# Patient Record
Sex: Female | Born: 1990 | Race: White | Hispanic: No | Marital: Married | Smoking: Current every day smoker
Health system: Southern US, Community
[De-identification: ages and names within clinical notes are randomized; demographics above are authoritative.]

## PROBLEM LIST (undated history)

## (undated) ENCOUNTER — Inpatient Hospital Stay (HOSPITAL_COMMUNITY): Payer: Self-pay

## (undated) DIAGNOSIS — F419 Anxiety disorder, unspecified: Secondary | ICD-10-CM

## (undated) DIAGNOSIS — L732 Hidradenitis suppurativa: Secondary | ICD-10-CM

## (undated) DIAGNOSIS — F32A Depression, unspecified: Secondary | ICD-10-CM

## (undated) DIAGNOSIS — F329 Major depressive disorder, single episode, unspecified: Secondary | ICD-10-CM

## (undated) DIAGNOSIS — J45909 Unspecified asthma, uncomplicated: Secondary | ICD-10-CM

## (undated) HISTORY — PX: TONSILLECTOMY: SUR1361

## (undated) HISTORY — PX: DILATION AND CURETTAGE OF UTERUS: SHX78

## (undated) HISTORY — PX: RHINOPLASTY: SUR1284

---
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
## 2017-02-06 ENCOUNTER — Emergency Department (HOSPITAL_COMMUNITY): Payer: 59

## 2017-02-06 ENCOUNTER — Encounter (HOSPITAL_COMMUNITY): Payer: Self-pay

## 2017-02-06 ENCOUNTER — Inpatient Hospital Stay (HOSPITAL_COMMUNITY)
Admission: AD | Admit: 2017-02-06 | Discharge: 2017-02-06 | Disposition: A | Discharge status: Home / Self Care | Payer: 59 | Attending: Obstetrics & Gynecology | Admitting: Obstetrics & Gynecology

## 2017-02-06 DIAGNOSIS — O3680X1 Pregnancy with inconclusive fetal viability, fetus 1: Secondary | ICD-10-CM

## 2017-02-06 DIAGNOSIS — O209 Hemorrhage in early pregnancy, unspecified: Secondary | ICD-10-CM | POA: Diagnosis not present

## 2017-02-06 DIAGNOSIS — O26899 Other specified pregnancy related conditions, unspecified trimester: Secondary | ICD-10-CM | POA: Diagnosis present

## 2017-02-06 DIAGNOSIS — R102 Pelvic and perineal pain: Secondary | ICD-10-CM | POA: Diagnosis present

## 2017-02-06 DIAGNOSIS — Z3A01 Less than 8 weeks gestation of pregnancy: Secondary | ICD-10-CM | POA: Insufficient documentation

## 2017-02-06 DIAGNOSIS — O3680X0 Pregnancy with inconclusive fetal viability, not applicable or unspecified: Secondary | ICD-10-CM

## 2017-02-06 DIAGNOSIS — O26891 Other specified pregnancy related conditions, first trimester: Secondary | ICD-10-CM | POA: Diagnosis not present

## 2017-02-06 DIAGNOSIS — O4690 Antepartum hemorrhage, unspecified, unspecified trimester: Secondary | ICD-10-CM

## 2017-02-06 HISTORY — DX: Major depressive disorder, single episode, unspecified: F32.9

## 2017-02-06 HISTORY — DX: Unspecified asthma, uncomplicated: J45.909

## 2017-02-06 HISTORY — DX: Depression, unspecified: F32.A

## 2017-02-06 HISTORY — DX: Anxiety disorder, unspecified: F41.9

## 2017-02-06 LAB — URINALYSIS, ROUTINE W REFLEX MICROSCOPIC
Bilirubin Urine: NEGATIVE
Glucose, UA: NEGATIVE mg/dL
Ketones, ur: NEGATIVE mg/dL
Nitrite: NEGATIVE
Protein, ur: NEGATIVE mg/dL
Specific Gravity, Urine: 1.017 (ref 1.005–1.030)
pH: 6 (ref 5.0–8.0)

## 2017-02-06 LAB — CBC
HCT: 41.5 % (ref 36.0–46.0)
HEMATOCRIT: 39.2 % (ref 36.0–46.0)
Hemoglobin: 14 g/dL (ref 12.0–15.0)
Hemoglobin: 13.5 g/dL (ref 12.0–15.0)
MCH: 29.2 pg (ref 26.0–34.0)
MCH: 29.9 pg (ref 26.0–34.0)
MCHC: 33.7 g/dL (ref 30.0–36.0)
MCHC: 34.4 g/dL (ref 30.0–36.0)
MCV: 86.6 fL (ref 78.0–100.0)
MCV: 86.7 fL (ref 78.0–100.0)
Platelets: 271 10*3/uL (ref 150–400)
Platelets: 233 10*3/uL (ref 150–400)
RBC: 4.79 MIL/uL (ref 3.87–5.11)
RBC: 4.52 MIL/uL (ref 3.87–5.11)
RDW: 13.1 % (ref 11.5–15.5)
RDW: 13.4 % (ref 11.5–15.5)
WBC: 9.7 10*3/uL (ref 4.0–10.5)
WBC: 8.3 10*3/uL (ref 4.0–10.5)

## 2017-02-06 LAB — COMPREHENSIVE METABOLIC PANEL
ALT: 11 U/L — ABNORMAL LOW (ref 14–54)
AST: 16 U/L (ref 15–41)
Albumin: 3.6 g/dL (ref 3.5–5.0)
Alkaline Phosphatase: 55 U/L (ref 38–126)
Anion gap: 7 (ref 5–15)
BUN: 9 mg/dL (ref 6–20)
CO2: 22 mmol/L (ref 22–32)
Calcium: 8.9 mg/dL (ref 8.9–10.3)
Chloride: 106 mmol/L (ref 101–111)
Creatinine, Ser: 0.57 mg/dL (ref 0.44–1.00)
GFR calc Af Amer: >60 mL/min (ref >60)
GFR calc non Af Amer: >60 mL/min (ref >60)
Glucose, Bld: 92 mg/dL (ref 65–99)
Potassium: 3.6 mmol/L (ref 3.5–5.1)
Sodium: 135 mmol/L (ref 135–145)
Total Bilirubin: 0.5 mg/dL (ref 0.3–1.2)
Total Protein: 6.8 g/dL (ref 6.5–8.1)

## 2017-02-06 LAB — WET PREP, GENITAL
Sperm: NONE SEEN
Trich, Wet Prep: NONE SEEN
Yeast Wet Prep HPF POC: NONE SEEN

## 2017-02-06 LAB — LIPASE, BLOOD: Lipase: 35 U/L (ref 11–51)

## 2017-02-06 LAB — PREGNANCY, URINE: Preg Test, Ur: POSITIVE — AB

## 2017-02-06 LAB — TYPE AND SCREEN
ABO/RH(D): B POS
ANTIBODY SCREEN: NEGATIVE

## 2017-02-06 LAB — HCG, QUANTITATIVE, PREGNANCY: hCG, Beta Chain, Quant, S: 467 m[IU]/mL — ABNORMAL HIGH (ref <5)

## 2017-02-06 LAB — ABO/RH
ABO/RH(D): B POS
ABO/RH(D): B POS

## 2017-02-06 MED ORDER — PROMETHAZINE HCL 25 MG PO TABS: 25.0000 mg | ORAL_TABLET | Freq: Four times a day (QID) | ORAL | 0 refills | Status: DC | PRN

## 2017-02-06 MED ORDER — SODIUM CHLORIDE 0.9 % IV BOLUS (SEPSIS)
1000.0000 mL | Freq: Once | INTRAVENOUS | Status: AC
  Administered 2017-02-06: 1000 mL via INTRAVENOUS

## 2017-02-06 MED ORDER — MORPHINE SULFATE (PF) 4 MG/ML IV SOLN
4.0000 mg | Freq: Once | INTRAVENOUS | Status: AC
  Administered 2017-02-06: 4 mg via INTRAVENOUS
  Filled 2017-02-06: qty 1

## 2017-02-06 MED ORDER — LACTATED RINGERS IV SOLN
INTRAVENOUS | Status: DC
  Administered 2017-02-06: 09:00:00 via INTRAVENOUS

## 2017-02-06 MED ORDER — HYDROMORPHONE HCL 1 MG/ML IJ SOLN
1.0000 mg | Freq: Once | INTRAMUSCULAR | Status: AC
  Administered 2017-02-06: 1 mg via INTRAVENOUS
  Filled 2017-02-06: qty 1

## 2017-02-06 MED ORDER — PROMETHAZINE HCL 25 MG/ML IJ SOLN
25.0000 mg | Freq: Once | INTRAMUSCULAR | Status: AC
  Administered 2017-02-06: 25 mg via INTRAVENOUS
  Filled 2017-02-06: qty 1

## 2017-02-06 MED ORDER — OXYCODONE-ACETAMINOPHEN 5-325 MG PO TABS: 1.0000 | ORAL_TABLET | Freq: Four times a day (QID) | ORAL | 0 refills | Status: DC | PRN

## 2017-02-06 NOTE — ED Notes (Signed)
Pt went to give urine sample and  States she has started bleeding a moderate amount; Pt was given sanitary pad; and chart updated

## 2017-02-06 NOTE — ED Provider Notes (Addendum)
[facility]-EMERGENCY DEPT Provider Note   CSN: [id number] Arrival date & time: 02/06/17  0136     History   Chief Complaint Chief Complaint  Patient presents with  . Abdominal Pain  . Possible Pregnancy  . Threatened Miscarriage    HPI Brittany Maldonado is a 26 y.o. female.  Patient presents to the emergency department with chief complaint of vaginal bleeding and abdominal pain. She reports that she took an at-home pregnancy test last week and it was positive. She reports worsening symptoms over the past couple of days. Symptoms are worsened with palpation and movement. She denies any dysuria or hematuria. She does report that she has had some nausea, but denies any vomiting. She does not currently have an OB/GYN. There are no other associated symptoms. There are no other modifying factors.   The history is provided by the patient. No language interpreter was used.    History reviewed. No pertinent past medical history.  There are no active problems to display for this patient.   History reviewed. No pertinent surgical history.  OB History    Gravida Para Term Preterm AB Living   1             SAB TAB Ectopic Multiple Live Births                   Home Medications    Prior to Admission medications   Not on File    Family History No family history on file.  Social History Social History  Substance Use Topics  . Smoking status: Former Smoker    Quit date: 02/05/2017  . Smokeless tobacco: Not on file  . Alcohol use Not on file     Allergies   Patient has no allergy information on record.   Review of Systems Review of Systems  All other systems reviewed and are negative.    Physical Exam Updated Vital Signs BP (!) 97/55   Pulse 73   Temp 98.6 F (37 C) (Oral)   Resp (!) 22   LMP  (LMP Unknown)   SpO2 99%   Physical Exam  Constitutional: She is oriented to person, place, and time. She appears well-developed and well-nourished.  HENT:  Head:  Normocephalic and atraumatic.  Eyes: Conjunctivae and EOM are normal. Pupils are equal, round, and reactive to light.  Neck: Normal range of motion. Neck supple.  Cardiovascular: Normal rate and regular rhythm.  Exam reveals no gallop and no friction rub.   No murmur heard. Pulmonary/Chest: Effort normal and breath sounds normal. No respiratory distress. She has no wheezes. She has no rales. She exhibits no tenderness.  Abdominal: Soft. Bowel sounds are normal. She exhibits no distension and no mass. There is no tenderness. There is no rebound and no guarding.  Genitourinary:  Genitourinary Comments: Pelvic exam chaperoned by female ER tech, no right or left adnexal tenderness, no uterine tenderness, scant vaginal bleeding, no CMT or friability, no foreign body, no injury to the external genitalia, no other significant findings   Musculoskeletal: Normal range of motion. She exhibits no edema or tenderness.  Neurological: She is alert and oriented to person, place, and time.  Skin: Skin is warm and dry.  Psychiatric: She has a normal mood and affect. Her behavior is normal. Judgment and thought content normal.  Nursing note and vitals reviewed.    ED Treatments / Results  Labs (all labs ordered are listed, but only abnormal results are displayed) Labs Reviewed  COMPREHENSIVE METABOLIC  PANEL - Abnormal; Notable for the following:       Result Value   ALT 11 (*)    All other components within normal limits  URINALYSIS, ROUTINE W REFLEX MICROSCOPIC - Abnormal; Notable for the following:    Hgb urine dipstick LARGE (*)    Leukocytes, UA SMALL (*)    Bacteria, UA RARE (*)    Squamous Epithelial / LPF 0-5 (*)    All other components within normal limits  PREGNANCY, URINE - Abnormal; Notable for the following:    Preg Test, Ur POSITIVE (*)    All other components within normal limits  HCG, QUANTITATIVE, PREGNANCY - Abnormal; Notable for the following:    hCG, Beta Chain, Quant, S 467 (*)     All other components within normal limits  WET PREP, GENITAL  LIPASE, BLOOD  CBC  I-STAT BETA HCG BLOOD, ED ([facility], [facility], [facility] ONLY)  ABO/RH  GC/CHLAMYDIA PROBE AMP ([place]) NOT AT [facility]    EKG  EKG Interpretation None       Radiology US Ob Comp Less 14 Wks  Result Date: 02/06/2017 CLINICAL DATA:  Acute onset of pelvic pain.  Initial encounter. EXAM: OBSTETRIC <14 WK [place] AND TRANSVAGINAL OB US TECHNIQUE: Both transabdominal and transvaginal ultrasound examinations were performed for complete evaluation of the gestation as well as the maternal uterus, adnexal regions, and pelvic cul-de-sac. Transvaginal technique was performed to assess early pregnancy. COMPARISON:  None. FINDINGS: Intrauterine gestational sac: None seen. Yolk sac:  N/A Embryo:  N/A Subchorionic hemorrhage:  None visualized. Maternal uterus/adnexae: A small amount of complex fluid is noted within the endometrial canal. There appears to be a small 0.9 cm complex lesion with a central cyst medial to the right ovary, raising suspicion for ectopic pregnancy. The ovaries are unremarkable in appearance. The right ovary measures 3.3 x 2.4 x 2.5 cm, with a corpus luteal cyst, while the left ovary measures 2.7 x 1.7 x 2.4 cm. A small amount of free fluid is noted at the right adnexa. IMPRESSION: 1. Apparent small 0.9 cm complex lesion with a central cyst noted medial to the right ovary, suspicious for ectopic pregnancy. Associated small amount of free fluid at the right adnexa. 2. No evidence for intrauterine pregnancy at this time. 3. Small amount of complex fluid noted within the endometrial canal. These results were called by telephone at the time of interpretation on 02/06/2017 at 6:12 am to [facility] PA, who verbally acknowledged these results. Electronically Signed   By: Roanna Raider M.D.   On: 02/06/2017 06:18   US Ob Transvaginal  Result Date: 02/06/2017 CLINICAL DATA:  Acute onset of pelvic pain.  Initial encounter. EXAM:  OBSTETRIC <14 WK [place] AND TRANSVAGINAL OB US TECHNIQUE: Both transabdominal and transvaginal ultrasound examinations were performed for complete evaluation of the gestation as well as the maternal uterus, adnexal regions, and pelvic cul-de-sac. Transvaginal technique was performed to assess early pregnancy. COMPARISON:  None. FINDINGS: Intrauterine gestational sac: None seen. Yolk sac:  N/A Embryo:  N/A Subchorionic hemorrhage:  None visualized. Maternal uterus/adnexae: A small amount of complex fluid is noted within the endometrial canal. There appears to be a small 0.9 cm complex lesion with a central cyst medial to the right ovary, raising suspicion for ectopic pregnancy. The ovaries are unremarkable in appearance. The right ovary measures 3.3 x 2.4 x 2.5 cm, with a corpus luteal cyst, while the left ovary measures 2.7 x 1.7 x 2.4 cm. A small amount of free  fluid is noted at the right adnexa. IMPRESSION: 1. Apparent small 0.9 cm complex lesion with a central cyst noted medial to the right ovary, suspicious for ectopic pregnancy. Associated small amount of free fluid at the right adnexa. 2. No evidence for intrauterine pregnancy at this time. 3. Small amount of complex fluid noted within the endometrial canal. These results were called by telephone at the time of interpretation on 02/06/2017 at 6:12 am to [facility], who verbally acknowledged these results. Electronically Signed   By: Roanna RaiderJeffery  Chang M.D.   On: 02/06/2017 06:18    Procedures Procedures (including critical care time) CRITICAL CARE Performed by: Roxy HorsemanBROWNING, Khayri Kargbo   Total critical care time: 35 minutes  Critical care time was exclusive of separately billable procedures and treating other patients.  Critical care was necessary to treat or prevent imminent or life-threatening deterioration.  Critical care was time spent personally by me on the following activities: development of treatment plan with patient and/or surrogate as well as  nursing, discussions with consultants, evaluation of patient's response to treatment, examination of patient, obtaining history from patient or surrogate, ordering and performing treatments and interventions, ordering and review of laboratory studies, ordering and review of radiographic studies, pulse oximetry and re-evaluation of patient's condition.  Medications Ordered in ED Medications  morphine 4 MG/ML injection 4 mg (not administered)     Initial Impression / Assessment and Plan / ED Course  I have reviewed the triage vital signs and the nursing notes.  Pertinent labs & imaging results that were available during my care of the patient were reviewed by me and considered in my medical decision making (see chart for details).     Patient with vaginal bleeding, abdominal pain, and recent positive pregnancy test. Will check Rh, hCG, labs, and likely ultrasound given pain.  US shows concern for possible ectopic pregnancy.  I discussed the patient with Dr. Silverio LayYao, who recommends consultation with OBGYN.  Spoke with on-call and [facility], who recommends transferring patient now for further evaluation.  Final Clinical Impressions(s) / ED Diagnoses   Final diagnoses:  Vaginal bleeding in pregnancy  Pregnancy of unknown anatomic location    New Prescriptions New Prescriptions   No medications on file     Felipa FurnaceBrowning, Evalise Abruzzese, PA-C 02/06/17 [id number]    Charlynne PanderYao, David Hsienta, MD 02/06/17 0730    Roxy HorsemanBrowning, Aspen Deterding, PA-C 02/06/17 2316

## 2017-02-06 NOTE — MAU Note (Signed)
[facility] further eval for suspected ectopic preg.  Shoots up to ribs, and sharp pressure on rectum when sits up, worse when walks.

## 2017-02-06 NOTE — ED Triage Notes (Signed)
Pt states she has onset of abdominal pain with pressure; Pt states sx started today; pt states she has positive pregnancy test about a week ago. Pt states pain at 8/10 on arrival.

## 2017-02-06 NOTE — Discharge Instructions (Signed)
Return to care  °· If you have heavier bleeding that soaks through more that 2 pads per hour for an hour or more °· If you bleed so much that you feel like you might pass out or you do pass out °· If you have significant abdominal pain that is not improved with Tylenol  °· If you develop a fever > 100.5 ° ° °Abdominal Pain During Pregnancy °Abdominal pain is common in pregnancy. Most of the time, it does not cause harm. There are many causes of abdominal pain. Some causes are more serious than others and sometimes the cause is not known. Abdominal pain can be a sign that something is very wrong with the pregnancy or the pain may have nothing to do with the pregnancy. Always tell your health care provider if you have any abdominal pain. °Follow these instructions at home: °· Do not have sex or put anything in your vagina until your symptoms go away completely. °· Watch your abdominal pain for any changes. °· Get plenty of rest until your pain improves. °· Drink enough fluid to keep your urine clear or pale yellow. °· Take over-the-counter or prescription medicines only as told by your health care provider. °· Keep all follow-up visits as told by your health care provider. This is important. °Contact a health care provider if: °· You have a fever. °· Your pain gets worse or you have cramping. °· Your pain continues after resting. °Get help right away if: °· You are bleeding, leaking fluid, or passing tissue from the vagina. °· You have vomiting or diarrhea that does not go away. °· You have painful or bloody urination. °· You notice a decrease in your baby's movements. °· You feel very weak or faint. °· You have shortness of breath. °· You develop a severe headache with abdominal pain. °· You have abnormal vaginal discharge with abdominal pain. °This information is not intended to replace advice given to you by your health care provider. Make sure you discuss any questions you have with your health care  provider. °Document Released: 08/24/2005 Document Revised: 06/04/2016 Document Reviewed: 03/23/2013 °Elsevier Interactive Patient Education © 2018 Elsevier Inc. ° °

## 2017-02-06 NOTE — ED Notes (Signed)
Patient transported to Ultrasound 

## 2017-02-06 NOTE — MAU Provider Note (Signed)
History     CSN: [id number]  Arrival date and time: 02/06/17 0136  First Provider Initiated Contact with Patient 02/06/17 [phone]      Chief Complaint  Patient presents with  . Abdominal Pain  . Vaginal Bleeding   HPI Zyair Rhein is a 26 y.o. G3P1011 at [redacted]w[redacted]d by LMP who presents as a transfer from [facility] for ectopic evaluation. Patient reports having a positive pregnancy test last week; has not seen ob/gyn yet. Reports lower abdominal cramping for the last week that worsened overnight. Since last night has had sharp constant abdominal pain that is worse in the right lower quadrant. Varying intensity that is worse with sitting up, walking, and standing. Rates pain 9/10. Was given morphine at the ED with mild relief. Also reports red spotting since yesterday. Endorses nausea; no vomiting.  OB History    Gravida Para Term Preterm AB Living   3 1 1  0 1 1   SAB TAB Ectopic Multiple Live Births     1     1      Past Medical History:  Diagnosis Date  . Anxiety   . Asthma    exercise induced  . Depression    good  right now    Past Surgical History:  Procedure Laterality Date  . RHINOPLASTY     reset broken nose    Family History  Problem Relation Age of Onset  . COPD Father   . Stroke Maternal Grandfather   . Cancer Paternal Grandmother        lung    Social History  Substance Use Topics  . Smoking status: Former Smoker    Years: 9.00    Types: Cigarettes    Quit date: 02/05/2017  . Smokeless tobacco: Never Used  . Alcohol use No    Allergies: No Known Allergies  No prescriptions prior to admission.    Review of Systems  Constitutional: Negative.   Gastrointestinal: Positive for abdominal pain, nausea and rectal pain. Negative for abdominal distention, constipation, diarrhea and vomiting.  Genitourinary: Positive for vaginal bleeding.   Physical Exam   Blood pressure (!) 105/52, pulse (!) 53, temperature 98.4 F (36.9 C), temperature source Oral, resp. rate  16, last menstrual period 01/05/2017, SpO2 99 %.  Physical Exam  Nursing note and vitals reviewed. Constitutional: She is oriented to person, place, and time. She appears well-developed and well-nourished. No distress.  HENT:  Head: Normocephalic and atraumatic.  Eyes: Conjunctivae are normal. Right eye exhibits no discharge. Left eye exhibits no discharge. No scleral icterus.  Neck: Normal range of motion.  Cardiovascular: Normal rate, regular rhythm and normal heart sounds.   No murmur heard. Respiratory: Effort normal and breath sounds normal. No respiratory distress. She has no wheezes.  GI: Soft. She exhibits no distension. Bowel sounds are decreased. There is generalized tenderness (generalized TTP, worse in RLQ & suprapubic area). There is no rigidity, no rebound and no guarding.  Neurological: She is alert and oriented to person, place, and time.  Skin: Skin is warm and dry. She is not diaphoretic.  Psychiatric: She has a normal mood and affect. Her behavior is normal. Judgment and thought content normal.    MAU Course  Procedures Results for orders placed or performed during the hospital encounter of 02/06/17 (from the past 24 hour(s))  Lipase, blood     Status: None   Collection Time: 02/06/17  1:51 AM  Result Value Ref Range   Lipase 35 11 - 51 U/L  Comprehensive metabolic panel     Status: Abnormal   Collection Time: 02/06/17  1:51 AM  Result Value Ref Range   Sodium 135 135 - 145 mmol/L   Potassium 3.6 3.5 - 5.1 mmol/L   Chloride 106 101 - 111 mmol/L   CO2 22 22 - 32 mmol/L   Glucose, Bld 92 65 - 99 mg/dL   BUN 9 6 - 20 mg/dL   Creatinine, Ser 4.09 0.44 - 1.00 mg/dL   Calcium 8.9 8.9 - 81.1 mg/dL   Total Protein 6.8 6.5 - 8.1 g/dL   Albumin 3.6 3.5 - 5.0 g/dL   AST 16 15 - 41 U/L   ALT 11 (L) 14 - 54 U/L   Alkaline Phosphatase 55 38 - 126 U/L   Total Bilirubin 0.5 0.3 - 1.2 mg/dL   GFR calc non Af Amer >60 >60 mL/min   GFR calc Af Amer >60 >60 mL/min   Anion  gap 7 5 - 15  CBC     Status: None   Collection Time: 02/06/17  1:51 AM  Result Value Ref Range   WBC 9.7 4.0 - 10.5 K/uL   RBC 4.79 3.87 - 5.11 MIL/uL   Hemoglobin 14.0 12.0 - 15.0 g/dL   HCT 91.4 78.2 - 95.6 %   MCV 86.6 78.0 - 100.0 fL   MCH 29.2 26.0 - 34.0 pg   MCHC 33.7 30.0 - 36.0 g/dL   RDW 21.3 08.6 - 57.8 %   Platelets 271 150 - 400 K/uL  Urinalysis, Routine w reflex microscopic     Status: Abnormal   Collection Time: 02/06/17  1:52 AM  Result Value Ref Range   Color, Urine YELLOW YELLOW   APPearance CLEAR CLEAR   Specific Gravity, Urine 1.017 1.005 - 1.030   pH 6.0 5.0 - 8.0   Glucose, UA NEGATIVE NEGATIVE mg/dL   Hgb urine dipstick LARGE (A) NEGATIVE   Bilirubin Urine NEGATIVE NEGATIVE   Ketones, ur NEGATIVE NEGATIVE mg/dL   Protein, ur NEGATIVE NEGATIVE mg/dL   Nitrite NEGATIVE NEGATIVE   Leukocytes, UA SMALL (A) NEGATIVE   RBC / HPF TOO NUMEROUS TO COUNT 0 - 5 RBC/hpf   WBC, UA 0-5 0 - 5 WBC/hpf   Bacteria, UA RARE (A) NONE SEEN   Squamous Epithelial / LPF 0-5 (A) NONE SEEN   Mucous PRESENT   Pregnancy, urine     Status: Abnormal   Collection Time: 02/06/17  3:25 AM  Result Value Ref Range   Preg Test, Ur POSITIVE (A) NEGATIVE  ABO/Rh     Status: None   Collection Time: 02/06/17  4:27 AM  Result Value Ref Range   ABO/RH(D) B POS   hCG, quantitative, pregnancy     Status: Abnormal   Collection Time: 02/06/17  4:34 AM  Result Value Ref Range   hCG, Beta Chain, Quant, S 467 (H) <5 mIU/mL  Wet prep, genital     Status: Abnormal   Collection Time: 02/06/17  6:30 AM  Result Value Ref Range   Yeast Wet Prep HPF POC NONE SEEN NONE SEEN   Trich, Wet Prep NONE SEEN NONE SEEN   Clue Cells Wet Prep HPF POC PRESENT (A) NONE SEEN   WBC, Wet Prep HPF POC MANY (A) NONE SEEN   Sperm NONE SEEN   CBC     Status: None   Collection Time: 02/06/17  9:24 AM  Result Value Ref Range   WBC 8.3 4.0 - 10.5 K/uL   RBC  4.52 3.87 - 5.11 MIL/uL   Hemoglobin 13.5 12.0 - 15.0  g/dL   HCT 95.639.2 21.336.0 - 08.646.0 %   MCV 86.7 78.0 - 100.0 fL   MCH 29.9 26.0 - 34.0 pg   MCHC 34.4 30.0 - 36.0 g/dL   RDW 57.813.4 46.911.5 - 62.915.5 %   Platelets 233 150 - 400 K/uL   [place] Ob Comp Less 14 Wks  Result Date: 02/06/2017 CLINICAL DATA:  Acute onset of pelvic pain.  Initial encounter. EXAM: OBSTETRIC <14 WK US AND TRANSVAGINAL OB US TECHNIQUE: Both transabdominal and transvaginal ultrasound examinations were performed for complete evaluation of the gestation as well as the maternal uterus, adnexal regions, and pelvic cul-de-sac. Transvaginal technique was performed to assess early pregnancy. COMPARISON:  None. FINDINGS: Intrauterine gestational sac: None seen. Yolk sac:  N/A Embryo:  N/A Subchorionic hemorrhage:  None visualized. Maternal uterus/adnexae: A small amount of complex fluid is noted within the endometrial canal. There appears to be a small 0.9 cm complex lesion with a central cyst medial to the right ovary, raising suspicion for ectopic pregnancy. The ovaries are unremarkable in appearance. The right ovary measures 3.3 x 2.4 x 2.5 cm, with a corpus luteal cyst, while the left ovary measures 2.7 x 1.7 x 2.4 cm. A small amount of free fluid is noted at the right adnexa. IMPRESSION: 1. Apparent small 0.9 cm complex lesion with a central cyst noted medial to the right ovary, suspicious for ectopic pregnancy. Associated small amount of free fluid at the right adnexa. 2. No evidence for intrauterine pregnancy at this time. 3. Small amount of complex fluid noted within the endometrial canal. These results were called by telephone at the time of interpretation on 02/06/2017 at 6:12 am to [facility], who verbally acknowledged these results. Electronically Signed   By: Roanna RaiderJeffery  Chang M.D.   On: 02/06/2017 06:18   [place] Ob Transvaginal  Result Date: 02/06/2017 CLINICAL DATA:  Acute onset of pelvic pain.  Initial encounter. EXAM: OBSTETRIC <14 WK US AND TRANSVAGINAL OB US TECHNIQUE: Both transabdominal  and transvaginal ultrasound examinations were performed for complete evaluation of the gestation as well as the maternal uterus, adnexal regions, and pelvic cul-de-sac. Transvaginal technique was performed to assess early pregnancy. COMPARISON:  None. FINDINGS: Intrauterine gestational sac: None seen. Yolk sac:  N/A Embryo:  N/A Subchorionic hemorrhage:  None visualized. Maternal uterus/adnexae: A small amount of complex fluid is noted within the endometrial canal. There appears to be a small 0.9 cm complex lesion with a central cyst medial to the right ovary, raising suspicion for ectopic pregnancy. The ovaries are unremarkable in appearance. The right ovary measures 3.3 x 2.4 x 2.5 cm, with a corpus luteal cyst, while the left ovary measures 2.7 x 1.7 x 2.4 cm. A small amount of free fluid is noted at the right adnexa. IMPRESSION: 1. Apparent small 0.9 cm complex lesion with a central cyst noted medial to the right ovary, suspicious for ectopic pregnancy. Associated small amount of free fluid at the right adnexa. 2. No evidence for intrauterine pregnancy at this time. 3. Small amount of complex fluid noted within the endometrial canal. These results were called by telephone at the time of interpretation on 02/06/2017 at 6:12 am to [facility], who verbally acknowledged these results. Electronically Signed   By: Roanna RaiderJeffery  Chang M.D.   On: 02/06/2017 06:18    MDM B positive Labs & ultrasound performed at ED; suspicious right adnexal mass measuring 0.9 cm, small  free fluid at right adnexa; no IUP. BHCG 467.  Pt requesting pain & nausea medication; dilaudid 1 mg & phenergan 25 mg IV ordered NPO Repeat CBC -- hemoglobin stable  Improvement in symptoms after meds S/w Dr. Debroah Loop. Will repeat BHCG in 48 hrs  This abdominal pain & vaginal bleeding could represent a normal pregnancy, spontaneous abortion, or even an ectopic pregnancy which can be life-threatening. Cultures were obtained (at [facility]) to rule out  pelvic infection.   Assessment and Plan  A:  1. Pregnancy of unknown anatomic location   2. Pelvic pain during pregnancy   3. Vaginal bleeding in pregnancy    P: Discharge home Rx percocet #10 & phenergan Discussed reasons to return to MAU - including s/s of ectopic pregnancy Go to [facility] Monday morning for repeat BHCG GC[facility] pending   Judeth Horn 02/06/2017, 8:33 AM

## 2017-02-07 ENCOUNTER — Encounter (HOSPITAL_COMMUNITY): Payer: Self-pay | Admitting: *Deleted

## 2017-02-07 ENCOUNTER — Inpatient Hospital Stay (HOSPITAL_COMMUNITY)
Admission: AD | Admit: 2017-02-07 | Discharge: 2017-02-07 | Disposition: A | Discharge status: Home / Self Care | Payer: 59 | Source: Ambulatory Visit | Attending: Obstetrics and Gynecology | Admitting: Obstetrics and Gynecology

## 2017-02-07 ENCOUNTER — Inpatient Hospital Stay (HOSPITAL_COMMUNITY): Payer: 59

## 2017-02-07 DIAGNOSIS — O26891 Other specified pregnancy related conditions, first trimester: Secondary | ICD-10-CM | POA: Diagnosis not present

## 2017-02-07 DIAGNOSIS — R42 Dizziness and giddiness: Secondary | ICD-10-CM | POA: Insufficient documentation

## 2017-02-07 DIAGNOSIS — O3680X1 Pregnancy with inconclusive fetal viability, fetus 1: Secondary | ICD-10-CM

## 2017-02-07 DIAGNOSIS — R1031 Right lower quadrant pain: Secondary | ICD-10-CM | POA: Diagnosis not present

## 2017-02-07 DIAGNOSIS — O3680X0 Pregnancy with inconclusive fetal viability, not applicable or unspecified: Secondary | ICD-10-CM

## 2017-02-07 DIAGNOSIS — Z87891 Personal history of nicotine dependence: Secondary | ICD-10-CM | POA: Insufficient documentation

## 2017-02-07 DIAGNOSIS — Z3A01 Less than 8 weeks gestation of pregnancy: Secondary | ICD-10-CM | POA: Diagnosis not present

## 2017-02-07 DIAGNOSIS — R109 Unspecified abdominal pain: Secondary | ICD-10-CM

## 2017-02-07 DIAGNOSIS — Z825 Family history of asthma and other chronic lower respiratory diseases: Secondary | ICD-10-CM | POA: Insufficient documentation

## 2017-02-07 DIAGNOSIS — O0281 Inappropriate change in quantitative human chorionic gonadotropin (hCG) in early pregnancy: Secondary | ICD-10-CM

## 2017-02-07 LAB — URINALYSIS, ROUTINE W REFLEX MICROSCOPIC
Bilirubin Urine: NEGATIVE
GLUCOSE, UA: NEGATIVE mg/dL
HGB URINE DIPSTICK: NEGATIVE
Ketones, ur: NEGATIVE mg/dL
Leukocytes, UA: NEGATIVE
Nitrite: NEGATIVE
PH: 7 (ref 5.0–8.0)
Protein, ur: NEGATIVE mg/dL
SPECIFIC GRAVITY, URINE: 1.004 — AB (ref 1.005–1.030)

## 2017-02-07 LAB — CBC
HEMATOCRIT: 39.5 % (ref 36.0–46.0)
Hemoglobin: 13.7 g/dL (ref 12.0–15.0)
MCH: 30 pg (ref 26.0–34.0)
MCHC: 34.7 g/dL (ref 30.0–36.0)
MCV: 86.4 fL (ref 78.0–100.0)
Platelets: 261 10*3/uL (ref 150–400)
RBC: 4.57 MIL/uL (ref 3.87–5.11)
RDW: 13.4 % (ref 11.5–15.5)
WBC: 7.9 10*3/uL (ref 4.0–10.5)

## 2017-02-07 LAB — HCG, QUANTITATIVE, PREGNANCY: hCG, Beta Chain, Quant, S: 123 m[IU]/mL — ABNORMAL HIGH (ref <5)

## 2017-02-07 MED ORDER — HYDROMORPHONE HCL 2 MG PO TABS: 2.0000 mg | ORAL_TABLET | ORAL | 0 refills | Status: AC | PRN

## 2017-02-07 MED ORDER — HYDROMORPHONE HCL 2 MG PO TABS
2.0000 mg | ORAL_TABLET | Freq: Once | ORAL | Status: AC
  Administered 2017-02-07: 2 mg via ORAL
  Filled 2017-02-07: qty 1

## 2017-02-07 MED ORDER — LACTATED RINGERS IV BOLUS (SEPSIS)
1000.0000 mL | Freq: Once | INTRAVENOUS | Status: AC
  Administered 2017-02-07: 1000 mL via INTRAVENOUS

## 2017-02-07 MED ORDER — HYDROMORPHONE HCL 1 MG/ML IJ SOLN
1.0000 mg | INTRAMUSCULAR | Status: DC | PRN
  Administered 2017-02-07: 1 mg via INTRAVENOUS
  Filled 2017-02-07: qty 1

## 2017-02-07 MED ORDER — PROMETHAZINE HCL 25 MG/ML IJ SOLN
12.5000 mg | Freq: Once | INTRAMUSCULAR | Status: AC
  Administered 2017-02-07: 12.5 mg via INTRAVENOUS
  Filled 2017-02-07: qty 1

## 2017-02-07 NOTE — MAU Provider Note (Signed)
History     CSN: [id number]  Arrival date and time: 02/07/17 1730   First Provider Initiated Contact with Patient 02/07/17 1759      Chief Complaint  Patient presents with  . Abdominal Pain   HPI Brittany Maldonado 26 y.o. 6934w5d  Was in MAU yesterday and diagnosed with pregnancy of unknown anatomic location.  Is back today with worsening abdominal pain, feeling her mouth is dry despite water intake today and dizziness.  Note reviewed from yesterday.  Client reports LMP was May 1 and was not a typical menses (was 3 days and light when normal menses is 5 days and much heavier).  Yesterday had morphine and dilaudid and was discharged with percocet.  Has taken percocet - two 5 mg tablets at a time and now has a .  Has not had pain medication since 10 am today.    OB History    Gravida Para Term Preterm AB Living   3 1 1  0 1 1   SAB TAB Ectopic Multiple Live Births     1     1      Past Medical History:  Diagnosis Date  . Anxiety   . Asthma    exercise induced  . Depression    good  right now    Past Surgical History:  Procedure Laterality Date  . RHINOPLASTY     reset broken nose    Family History  Problem Relation Age of Onset  . COPD Father   . Stroke Maternal Grandfather   . Cancer Paternal Grandmother        lung    Social History  Substance Use Topics  . Smoking status: Former Smoker    Years: 9.00    Types: Cigarettes    Quit date: 02/05/2017  . Smokeless tobacco: Never Used  . Alcohol use No    Allergies: No Known Allergies  Prescriptions Prior to Admission  Medication Sig Dispense Refill Last Dose  . oxyCODONE-acetaminophen (PERCOCET/ROXICET) 5-325 MG tablet Take 1-2 tablets by mouth every 6 (six) hours as needed. 10 tablet 0   . promethazine (PHENERGAN) 25 MG tablet Take 1 tablet (25 mg total) by mouth every 6 (six) hours as needed for nausea or vomiting. 30 tablet 0     Review of Systems  Constitutional: Negative for fever.       Dry mouth - feels  very dry even after drinking water  Gastrointestinal: Positive for abdominal pain. Negative for diarrhea, nausea and vomiting.  Genitourinary: Negative for dysuria, vaginal bleeding and vaginal discharge.  Neurological: Positive for dizziness.   Physical Exam   Blood pressure (!) 96/54, pulse 97, temperature 98.4 F (36.9 C), temperature source Oral, resp. rate 18, last menstrual period 01/05/2017.  Physical Exam  Nursing note and vitals reviewed. Constitutional: She is oriented to person, place, and time. She appears well-developed and well-nourished.  Requesting pain medication and treatment for her dry mouth  HENT:  Head: Normocephalic.  Eyes: EOM are normal.  Neck: Neck supple.  GI: Soft. There is tenderness. There is guarding.  Feels pain with light palpation in 3 quadrants.  Unable to fully assess RLQ as client will not let it be examined due to the amount of pain she is feeling.  Feels pain in RLQ when other quadrants are palpated  Musculoskeletal: Normal range of motion.  Neurological: She is alert and oriented to person, place, and time.  Skin: Skin is warm and dry.  Psychiatric: She has a normal  mood and affect.    MAU Course  Procedures Results for orders placed or performed during the hospital encounter of 02/07/17 (from the past 24 hour(s))  Urinalysis, Routine w reflex microscopic     Status: Abnormal   Collection Time: 02/07/17  5:40 PM  Result Value Ref Range   Color, Urine STRAW (A) YELLOW   APPearance CLEAR CLEAR   Specific Gravity, Urine 1.004 (Brittany) 1.005 - 1.030   pH 7.0 5.0 - 8.0   Glucose, UA NEGATIVE NEGATIVE mg/dL   Hgb urine dipstick NEGATIVE NEGATIVE   Bilirubin Urine NEGATIVE NEGATIVE   Ketones, ur NEGATIVE NEGATIVE mg/dL   Protein, ur NEGATIVE NEGATIVE mg/dL   Nitrite NEGATIVE NEGATIVE   Leukocytes, UA NEGATIVE NEGATIVE  CBC     Status: None   Collection Time: 02/07/17  6:30 PM  Result Value Ref Range   WBC 7.9 4.0 - 10.5 K/uL   RBC 4.57 3.87 -  5.11 MIL/uL   Hemoglobin 13.7 12.0 - 15.0 g/dL   HCT 09.8 11.9 - 14.7 %   MCV 86.4 78.0 - 100.0 fL   MCH 30.0 26.0 - 34.0 pg   MCHC 34.7 30.0 - 36.0 g/dL   RDW 82.9 56.2 - 13.0 %   Platelets 261 150 - 400 K/uL  hCG, quantitative, pregnancy     Status: Abnormal   Collection Time: 02/07/17  6:30 PM  Result Value Ref Range   hCG, Beta Chain, Quant, S 123 (H) <5 mIU/mL   CLINICAL DATA:  Right lower quadrant pain, question of ectopic pregnancy on 02/06/2018 ultrasound in the right adnexa.  EXAM: TRANSVAGINAL OB ULTRASOUND  TECHNIQUE: Transvaginal ultrasound was performed for complete evaluation of the gestation as well as the maternal uterus, adnexal regions, and pelvic cul-de-sac.  COMPARISON:  02/06/2017 ultrasound  FINDINGS: Intrauterine gestational sac: None  Yolk sac:  Not Visualized.  Embryo:  Not Visualized.  Cardiac Activity: Not Visualized.  Heart Rate: Not applicable.  Subchorionic hemorrhage:  None visualized.  Maternal uterus/adnexae: The complex right adnexal 9 mm cystic focus medial to the right ovary seen on yesterday's exam is not apparent despite aggressive scanning including pressure to the anterior abdominal wall. There is a corpus luteal cyst noted of the right ovary. No intrauterine pregnancy is identified. There is no free fluid.  IMPRESSION: The 9 mm complex cystic focus in the right adnexa is not apparent despite extensive imaging on current exam. Clinical laboratory correlation and follow-up imaging as deemed necessary is recommended. No intrauterine pregnancy is identified.   MDM Consult with Dr. Jolayne Panther and reviewed plan of care  - will start IVF and give pain medication.  Will repeat US and Dr. Jolayne Panther to see client after she finishes in surgery.  Will give Phenergan 12.5 mg IV as she is having nausea after Dilaudid.  Dr. Jolayne Panther in to discuss results with client as her family is requesting to see the MD.  Client was  noted to be sitting up and able to walk without assistance but still reports pain needing narcotic medication.  Client requesting additional pain medication before leaving.  [place] [facility] database reviewed before medication was prescribed.  Assessment and Plan  Pregnancy of unknown anatomic location currently with falling quants   Plan Will return on Tuesday for repeat quant Given paper prescription for dilaudid 2 mg tablets to take by mouth every 4 hours for severe pain. Continue pelvic rest Return sooner with worsening pain or vaginal bleeding.   Brittany Maldonado Brittany Maldonado 02/07/2017, 6:22 PM

## 2017-02-07 NOTE — MAU Note (Signed)
Pt reports she is continuing to have lower abd pain that radiates up into her rib cage. Feel very dizzy and lightheaded and feels like she is dehydrated. Feels like sh e had been running a fever.

## 2017-02-07 NOTE — MAU Note (Signed)
Pt back from U/S. Pt mother at bedside. Insisting that "the Doctor" needs to come in and tell them the results of U/S because the patient only saw nurse practitioners yesterday and today and that they were not told anything yesterday.  Let the patient and family know I would let there concerns be known to the practitioner.

## 2017-02-07 NOTE — Discharge Instructions (Signed)
Return on Tuesday for repeat lab work. Return sooner if your pain is worsening or if you are having vaginal bleeding. Get your pain medication filled and take as directed. You may also take Ibuprofen 600 mg by mouth with food every 6 hours to supplement your pain medication. Additionally you can take tylenol 650 mg by mouth every 4 hours as needed to supplement your pain medication.

## 2017-02-08 ENCOUNTER — Ambulatory Visit: Payer: Managed Care, Other (non HMO)

## 2017-02-08 LAB — GC/CHLAMYDIA PROBE AMP (~~LOC~~) NOT AT ARMC
Chlamydia: NEGATIVE
Neisseria Gonorrhea: NEGATIVE

## 2017-02-09 ENCOUNTER — Inpatient Hospital Stay (HOSPITAL_COMMUNITY)
Admission: AD | Admit: 2017-02-09 | Discharge: 2017-02-09 | Disposition: A | Discharge status: Home / Self Care | Payer: 59 | Source: Ambulatory Visit | Attending: Family Medicine | Admitting: Family Medicine

## 2017-02-09 DIAGNOSIS — O3680X1 Pregnancy with inconclusive fetal viability, fetus 1: Secondary | ICD-10-CM

## 2017-02-09 DIAGNOSIS — O3680X0 Pregnancy with inconclusive fetal viability, not applicable or unspecified: Secondary | ICD-10-CM

## 2017-02-09 DIAGNOSIS — O26891 Other specified pregnancy related conditions, first trimester: Secondary | ICD-10-CM | POA: Diagnosis not present

## 2017-02-09 DIAGNOSIS — Z3A01 Less than 8 weeks gestation of pregnancy: Secondary | ICD-10-CM | POA: Insufficient documentation

## 2017-02-09 LAB — HCG, QUANTITATIVE, PREGNANCY: HCG, BETA CHAIN, QUANT, S: 78 m[IU]/mL — AB (ref <5)

## 2017-02-09 MED ORDER — HYDROMORPHONE HCL 2 MG PO TABS: 2.0000 mg | ORAL_TABLET | Freq: Two times a day (BID) | ORAL | 0 refills | Status: AC | PRN

## 2017-02-09 NOTE — MAU Provider Note (Signed)
Patient Brittany Maldonado is a 26 y.o. G3P1011 At 9260w0d here for a repeat Beta HCG. [facility], then seen in the MAU on 6-3. She has been diagnosed with a pregnancy of unknown location, although originally she was thought to have an ectopic (see [facility] [place] note from 6-3 and 6-4).   Patient's bHCG on 02-07-2017 was 123, 48 hours later it is 78 (on 6-5).  [facility]   Strict ectopic precautions given and one refill of the Diluadid given (8 tablets).   Vitals:   02/09/17 1322  BP: 112/63  Pulse: 85  Resp: 18  Temp: 98.8 F (37.1 C)    1. Pregnancy of unknown anatomic location    2. Patient stable for discharge with strict ectopic precautions; [facility] am for repeat bhcg. Patient does not need to wait.   Luna KitchensKathryn Kooistra CNM

## 2017-02-09 NOTE — Discharge Instructions (Signed)
Ectopic Pregnancy °An ectopic pregnancy happens when a fertilized egg grows outside the uterus. A pregnancy cannot live outside of the uterus. This problem often happens in the fallopian tube. It is often caused by damage to the fallopian tube. °If this problem is found early, you may be treated with medicine. If your tube tears or bursts open (ruptures), you will bleed inside. This is an emergency. You will need surgery. Get help right away. °What are the signs or symptoms? °You may have normal pregnancy symptoms at first. These include: °· Missing your period. °· Feeling sick to your stomach (nauseous). °· Being tired. °· Having tender breasts. ° °Then, you may start to have symptoms that are not normal. These include: °· Pain with sex (intercourse). °· Bleeding from the vagina. This includes light bleeding (spotting). °· Belly (abdomen) or lower belly cramping or pain. This may be felt on one side. °· A fast heartbeat (pulse). °· Passing out (fainting) after going poop (bowel movement). ° °If your tube tears, you may have symptoms such as: °· Really bad pain in the belly or lower belly. This happens suddenly. °· Dizziness. °· Passing out. °· Shoulder pain. ° °Get help right away if: °You have any of these symptoms. This is an emergency. °This information is not intended to replace advice given to you by your health care provider. Make sure you discuss any questions you have with your health care provider. °Document Released: 11/20/2008 Document Revised: 01/30/2016 Document Reviewed: 04/05/2013 °Elsevier Interactive Patient Education © 2017 Elsevier Inc. ° °

## 2017-02-09 NOTE — MAU Note (Signed)
Pt presents to MAU for follow up BHCG. Pt denies any vaginal bleeding, Reports lower abdominal cramping and states it is time to take more pain medications

## 2017-02-09 NOTE — MAU Note (Signed)
PT reports to receptionist that she felt faint, offered [place] ale and declined, walked back to seat in lobby

## 2017-02-11 ENCOUNTER — Other Ambulatory Visit: Payer: Managed Care, Other (non HMO)

## 2017-02-11 DIAGNOSIS — O3680X0 Pregnancy with inconclusive fetal viability, not applicable or unspecified: Secondary | ICD-10-CM

## 2017-02-12 LAB — BETA HCG QUANT (REF LAB): hCG Quant: 58 m[IU]/mL

## 2017-02-17 ENCOUNTER — Telehealth: Payer: Self-pay | Admitting: *Deleted

## 2017-02-17 NOTE — Telephone Encounter (Addendum)
-----   Message from Levie HeritageJacob J Stinson, DO sent at 02/12/2017 11:39 AM EDT ----- Repeat quant in 1 week  6/13  1000  Called pt and informed her that the pregnancy hormone level has decreased as expected however will need to be checked again this week. This level needs to be checked weekly until the result is <5 in order to insure that the pregnancy has completely resolved. Pt stated that she has called several times to obtain these results and has not been called back. I addressed pt's frustration with an apology and further stated that we do not have any record of her call. Possibly she contacted another department or office. Typically we will call pt's with results within a couple days. Pt voiced understanding and agreed to appt tomorrow @ 1245 for lab draw. I advised pt to be sure and review her after visit summary tomorrow for correct contact number to this office.

## 2017-02-18 ENCOUNTER — Other Ambulatory Visit: Payer: 59

## 2017-03-08 ENCOUNTER — Emergency Department (HOSPITAL_BASED_OUTPATIENT_CLINIC_OR_DEPARTMENT_OTHER)
Admission: EM | Admit: 2017-03-08 | Discharge: 2017-03-08 | Disposition: A | Discharge status: Home / Self Care | Payer: 59 | Attending: Emergency Medicine | Admitting: Emergency Medicine

## 2017-03-08 ENCOUNTER — Encounter (HOSPITAL_BASED_OUTPATIENT_CLINIC_OR_DEPARTMENT_OTHER): Payer: Self-pay | Admitting: *Deleted

## 2017-03-08 ENCOUNTER — Emergency Department (HOSPITAL_BASED_OUTPATIENT_CLINIC_OR_DEPARTMENT_OTHER): Payer: 59

## 2017-03-08 DIAGNOSIS — K5900 Constipation, unspecified: Secondary | ICD-10-CM | POA: Diagnosis not present

## 2017-03-08 DIAGNOSIS — M545 Low back pain: Secondary | ICD-10-CM | POA: Diagnosis not present

## 2017-03-08 DIAGNOSIS — J45909 Unspecified asthma, uncomplicated: Secondary | ICD-10-CM | POA: Insufficient documentation

## 2017-03-08 DIAGNOSIS — F17210 Nicotine dependence, cigarettes, uncomplicated: Secondary | ICD-10-CM | POA: Insufficient documentation

## 2017-03-08 LAB — URINALYSIS, ROUTINE W REFLEX MICROSCOPIC
Bilirubin Urine: NEGATIVE
Glucose, UA: NEGATIVE mg/dL
Hgb urine dipstick: NEGATIVE
KETONES UR: NEGATIVE mg/dL
LEUKOCYTES UA: NEGATIVE
NITRITE: NEGATIVE
PH: 5.5 (ref 5.0–8.0)
PROTEIN: NEGATIVE mg/dL
Specific Gravity, Urine: 1.025 (ref 1.005–1.030)

## 2017-03-08 LAB — PREGNANCY, URINE: Preg Test, Ur: NEGATIVE

## 2017-03-08 MED ORDER — KETOROLAC TROMETHAMINE 60 MG/2ML IM SOLN
60.0000 mg | Freq: Once | INTRAMUSCULAR | Status: AC
  Administered 2017-03-08: 60 mg via INTRAMUSCULAR
  Filled 2017-03-08: qty 2

## 2017-03-08 MED ORDER — METHOCARBAMOL 500 MG PO TABS
1000.0000 mg | ORAL_TABLET | Freq: Once | ORAL | Status: AC
  Administered 2017-03-08: 1000 mg via ORAL
  Filled 2017-03-08: qty 2

## 2017-03-08 MED ORDER — IBUPROFEN 800 MG PO TABS: 800.0000 mg | ORAL_TABLET | Freq: Three times a day (TID) | ORAL | 0 refills | Status: DC
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
## 2017-03-08 NOTE — ED Notes (Signed)
Pt was triaged by this RN.
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
## 2017-03-08 NOTE — ED Notes (Signed)
Alert, NAD, calm, interactive, resps e/u, speaking in clear complete sentences, no dyspnea noted, reports pain improved but still remains, (denies:  nausea, dizziness).

## 2017-03-08 NOTE — ED Notes (Signed)
Patient transported to X-ray 

## 2017-03-08 NOTE — ED Notes (Signed)
Pt given ice pack and warm blankets.

## 2017-03-08 NOTE — ED Notes (Signed)
Pt to xray via wc

## 2017-03-20 ENCOUNTER — Encounter (HOSPITAL_BASED_OUTPATIENT_CLINIC_OR_DEPARTMENT_OTHER): Payer: Self-pay | Admitting: Emergency Medicine

## 2017-03-20 ENCOUNTER — Emergency Department (HOSPITAL_BASED_OUTPATIENT_CLINIC_OR_DEPARTMENT_OTHER)
Admission: EM | Admit: 2017-03-20 | Discharge: 2017-03-20 | Disposition: A | Discharge status: Home / Self Care | Payer: 59 | Attending: Emergency Medicine | Admitting: Emergency Medicine

## 2017-03-20 ENCOUNTER — Emergency Department (HOSPITAL_BASED_OUTPATIENT_CLINIC_OR_DEPARTMENT_OTHER): Payer: 59

## 2017-03-20 DIAGNOSIS — F17210 Nicotine dependence, cigarettes, uncomplicated: Secondary | ICD-10-CM | POA: Insufficient documentation

## 2017-03-20 DIAGNOSIS — J45909 Unspecified asthma, uncomplicated: Secondary | ICD-10-CM | POA: Insufficient documentation

## 2017-03-20 DIAGNOSIS — K5000 Crohn's disease of small intestine without complications: Secondary | ICD-10-CM | POA: Diagnosis not present

## 2017-03-20 DIAGNOSIS — R1013 Epigastric pain: Secondary | ICD-10-CM | POA: Diagnosis present

## 2017-03-20 LAB — URINALYSIS, MICROSCOPIC (REFLEX): WBC UA: NONE SEEN WBC/hpf (ref 0–5)

## 2017-03-20 LAB — CBC WITH DIFFERENTIAL/PLATELET
BASOS ABS: 0 10*3/uL (ref 0.0–0.1)
BASOS PCT: 0 %
EOS ABS: 0.3 10*3/uL (ref 0.0–0.7)
Eosinophils Relative: 4 %
HEMATOCRIT: 43 % (ref 36.0–46.0)
HEMOGLOBIN: 15.1 g/dL — AB (ref 12.0–15.0)
Lymphocytes Relative: 30 %
Lymphs Abs: 2.4 10*3/uL (ref 0.7–4.0)
MCH: 29.9 pg (ref 26.0–34.0)
MCHC: 35.1 g/dL (ref 30.0–36.0)
MCV: 85.1 fL (ref 78.0–100.0)
MONOS PCT: 6 %
Monocytes Absolute: 0.5 10*3/uL (ref 0.1–1.0)
NEUTROS ABS: 4.8 10*3/uL (ref 1.7–7.7)
NEUTROS PCT: 60 %
Platelets: 290 10*3/uL (ref 150–400)
RBC: 5.05 MIL/uL (ref 3.87–5.11)
RDW: 12.9 % (ref 11.5–15.5)
WBC: 8 10*3/uL (ref 4.0–10.5)

## 2017-03-20 LAB — URINALYSIS, ROUTINE W REFLEX MICROSCOPIC
Bilirubin Urine: NEGATIVE
Glucose, UA: NEGATIVE mg/dL
Ketones, ur: NEGATIVE mg/dL
Leukocytes, UA: NEGATIVE
Nitrite: NEGATIVE
PH: 7 (ref 5.0–8.0)
Protein, ur: NEGATIVE mg/dL
SPECIFIC GRAVITY, URINE: 1.003 — AB (ref 1.005–1.030)

## 2017-03-20 LAB — COMPREHENSIVE METABOLIC PANEL
ALBUMIN: 3.8 g/dL (ref 3.5–5.0)
ALK PHOS: 61 U/L (ref 38–126)
ALT: 13 U/L — AB (ref 14–54)
AST: 17 U/L (ref 15–41)
Anion gap: 8 (ref 5–15)
BILIRUBIN TOTAL: 0.2 mg/dL — AB (ref 0.3–1.2)
BUN: 11 mg/dL (ref 6–20)
CALCIUM: 9.1 mg/dL (ref 8.9–10.3)
CO2: 23 mmol/L (ref 22–32)
CREATININE: 0.61 mg/dL (ref 0.44–1.00)
Chloride: 107 mmol/L (ref 101–111)
GFR calc Af Amer: >60 mL/min (ref >60)
GFR calc non Af Amer: >60 mL/min (ref >60)
GLUCOSE: 103 mg/dL — AB (ref 65–99)
Potassium: 3.7 mmol/L (ref 3.5–5.1)
SODIUM: 138 mmol/L (ref 135–145)
TOTAL PROTEIN: 7 g/dL (ref 6.5–8.1)

## 2017-03-20 LAB — PREGNANCY, URINE: PREG TEST UR: NEGATIVE

## 2017-03-20 LAB — LIPASE, BLOOD: Lipase: 40 U/L (ref 11–51)

## 2017-03-20 MED ORDER — IOPAMIDOL (ISOVUE-300) INJECTION 61%
100.0000 mL | Freq: Once | INTRAVENOUS | Status: AC | PRN
  Administered 2017-03-20: 100 mL via INTRAVENOUS

## 2017-03-20 MED ORDER — SODIUM CHLORIDE 0.9 % IV BOLUS (SEPSIS)
1000.0000 mL | Freq: Once | INTRAVENOUS | Status: AC
  Administered 2017-03-20: 1000 mL via INTRAVENOUS

## 2017-03-20 MED ORDER — HYDROCODONE-ACETAMINOPHEN 5-325 MG PO TABS: 1.0000 | ORAL_TABLET | Freq: Four times a day (QID) | ORAL | 0 refills | Status: DC | PRN

## 2017-03-20 MED ORDER — ONDANSETRON HCL 4 MG/2ML IJ SOLN
4.0000 mg | Freq: Once | INTRAMUSCULAR | Status: AC
  Administered 2017-03-20: 4 mg via INTRAVENOUS
  Filled 2017-03-20: qty 2

## 2017-03-20 MED ORDER — PROMETHAZINE HCL 25 MG/ML IJ SOLN
12.5000 mg | Freq: Once | INTRAMUSCULAR | Status: AC
  Administered 2017-03-20: 12.5 mg via INTRAVENOUS
  Filled 2017-03-20: qty 1

## 2017-03-20 MED ORDER — CIPROFLOXACIN HCL 500 MG PO TABS: 500.0000 mg | ORAL_TABLET | Freq: Two times a day (BID) | ORAL | 0 refills | Status: DC

## 2017-03-20 MED ORDER — FENTANYL CITRATE (PF) 100 MCG/2ML IJ SOLN
50.0000 ug | Freq: Once | INTRAMUSCULAR | Status: AC
  Administered 2017-03-20: 50 ug via INTRAVENOUS
  Filled 2017-03-20: qty 2

## 2017-03-20 MED ORDER — MORPHINE SULFATE (PF) 4 MG/ML IV SOLN
4.0000 mg | Freq: Once | INTRAVENOUS | Status: AC
  Administered 2017-03-20: 4 mg via INTRAVENOUS
  Filled 2017-03-20: qty 1

## 2017-03-20 MED ORDER — GI COCKTAIL ~~LOC~~
30.0000 mL | Freq: Once | ORAL | Status: AC
  Administered 2017-03-20: 30 mL via ORAL
  Filled 2017-03-20: qty 30

## 2017-03-20 MED ORDER — METRONIDAZOLE 500 MG PO TABS: 500.0000 mg | ORAL_TABLET | Freq: Three times a day (TID) | ORAL | 0 refills | Status: DC

## 2017-03-20 NOTE — ED Notes (Signed)
Patient transported to CT 

## 2017-03-20 NOTE — ED Provider Notes (Signed)
Patient is a 26 year old female presenting with abdominal pain. She was initially evaluated by Dr. Rush Landmarkegeler, then signed out to me at shift change. She underwent a CT scan which revealed thickening of the terminal ileum consistent with terminal ileitis. She has no fever and no white count and the etiology of this is undetermined. I have discussed this finding with Dr. Ewing SchleinMagod from gastroenterology who recommends follow-up in the office. The patient is insisting on some sort of treatment and will be given Cipro and Flagyl in case this is a bacterial infection. She will also be given pain medicine. She understands to return if she develops worsening pain, high fevers, bloody stools, or other issues.   Geoffery Lyonselo, Onica Davidovich, MD 03/20/17 1910

## 2017-03-20 NOTE — ED Notes (Signed)
Requesting pain med and c/o of length of time in ED. Will inform ED MD of pain and spoke at length regarding flow of ED

## 2017-03-20 NOTE — ED Notes (Signed)
Pt ambulatory to BR unassisted, in NAD. 

## 2017-03-20 NOTE — Discharge Instructions (Signed)
Cipro and Flagyl as prescribed.  Hydrocodone as prescribed as needed for pain.  [facility]. Call to arrange this appointment. The contact information for the office has been provided in this discharge summary for you to call and make these arrangements.  Return to the emergency department in the meantime if you develop worsening pain, high fevers, bloody stools, or other new and concerning symptoms.

## 2017-03-20 NOTE — ED Provider Notes (Signed)
[facility]   CSN: [id number] Arrival date & time: 03/20/17  1201     History   Chief Complaint Chief Complaint  Patient presents with  . Abdominal Pain    HPI Brittany Maldonado is a 26 y.o. female.  The history is provided by the patient.  Abdominal Pain   This is a new problem. The current episode started more than 2 days ago. The problem occurs constantly. The problem has been gradually worsening. The pain is associated with suspicious food intake. The pain is located in the epigastric region. The quality of the pain is cramping and aching. The pain is at a severity of 8/10. The pain is severe. Associated symptoms include nausea, vomiting and frequency. Pertinent negatives include fever, diarrhea, constipation, dysuria, headaches and myalgias. The symptoms are aggravated by palpation. Nothing relieves the symptoms. Her past medical history does not include GERD.    Past Medical History:  Diagnosis Date  . Anxiety   . Asthma    exercise induced  . Depression    good  right now    There are no active problems to display for this patient.   Past Surgical History:  Procedure Laterality Date  . RHINOPLASTY     reset broken nose    OB History    Gravida Para Term Preterm AB Living   3 1 1  0 1 1   SAB TAB Ectopic Multiple Live Births     1     1       Home Medications    Prior to Admission medications   Medication Sig Start Date End Date Taking? Authorizing Provider  HYDROmorphone (DILAUDID) 2 MG tablet Take 1 tablet (2 mg total) by mouth every 12 (twelve) hours as needed for severe pain. 02/09/17 02/09/18  Marylene LandKooistra, Kathryn Lorraine, CNM  ibuprofen (ADVIL,MOTRIN) 800 MG tablet Take 1 tablet (800 mg total) by mouth 3 (three) times daily. 03/08/17   Palumbo, April, MD  promethazine (PHENERGAN) 25 MG tablet Take 1 tablet (25 mg total) by mouth every 6 (six) hours as needed for nausea or vomiting. 02/06/17   Judeth HornLawrence, Erin, NP    Family  History Family History  Problem Relation Age of Onset  . COPD Father   . Stroke Maternal Grandfather   . Cancer Paternal Grandmother        lung    Social History Social History  Substance Use Topics  . Smoking status: Current Every Day Smoker    Years: 9.00    Types: Cigarettes    Last attempt to quit: 02/05/2017  . Smokeless tobacco: Never Used  . Alcohol use No     Allergies   Patient has no known allergies.   Review of Systems Review of Systems  Constitutional: Positive for chills. Negative for fatigue and fever.  HENT: Negative for congestion and rhinorrhea.   Eyes: Negative for visual disturbance.  Respiratory: Negative for chest tightness, shortness of breath, wheezing and stridor.   Cardiovascular: Negative for chest pain and palpitations.  Gastrointestinal: Positive for abdominal pain, nausea and vomiting. Negative for abdominal distention, constipation and diarrhea.  Genitourinary: Positive for frequency. Negative for dysuria, menstrual problem, pelvic pain, vaginal bleeding and vaginal discharge.  Musculoskeletal: Negative for back pain, myalgias, neck pain and neck stiffness.  Skin: Negative for rash and wound.  Neurological: Negative for dizziness, light-headedness and headaches.  Psychiatric/Behavioral: Negative for agitation.  All other systems reviewed and are negative.    Physical Exam Updated Vital Signs  BP 112/76   Pulse 93   Temp 98.1 F (36.7 C) (Oral)   Resp 20   LMP 03/13/2017   SpO2 99%   Breastfeeding? No   Physical Exam  Constitutional: She appears well-developed and well-nourished. No distress.  HENT:  Head: Normocephalic and atraumatic.  Mouth/Throat: Oropharynx is clear and moist. No oropharyngeal exudate.  Eyes: Pupils are equal, round, and reactive to light. Conjunctivae are normal.  Neck: Normal range of motion. Neck supple.  Cardiovascular: Normal rate and regular rhythm.   No murmur heard. Pulmonary/Chest: Effort normal and  breath sounds normal. No respiratory distress. She has no wheezes. She exhibits no tenderness.  Abdominal: Soft. Normal appearance. There is tenderness in the epigastric area. There is no rigidity, no rebound and no CVA tenderness.    Musculoskeletal: She exhibits no edema.  Neurological: She is alert. No sensory deficit. She exhibits normal muscle tone.  Skin: Skin is warm and dry. Capillary refill takes less than 2 seconds. No rash noted. She is not diaphoretic. No erythema.  Psychiatric: She has a normal mood and affect.  Nursing note and vitals reviewed.    ED Treatments / Results  Labs (all labs ordered are listed, but only abnormal results are displayed) Labs Reviewed  PREGNANCY, URINE  URINALYSIS, ROUTINE W REFLEX MICROSCOPIC  CBC WITH DIFFERENTIAL/PLATELET  COMPREHENSIVE METABOLIC PANEL  LIPASE, BLOOD    EKG  EKG Interpretation None       Radiology No results found.  Procedures Procedures (including critical care time)  Medications Ordered in ED Medications  sodium chloride 0.9 % bolus 1,000 mL (1,000 mLs Intravenous New Bag/Given 03/20/17 1312)  ondansetron (ZOFRAN) injection 4 mg (4 mg Intravenous Given 03/20/17 1312)  morphine 4 MG/ML injection 4 mg (4 mg Intravenous Given 03/20/17 1312)     Initial Impression / Assessment and Plan / ED Course  I have reviewed the triage vital signs and the nursing notes.  Pertinent labs & imaging results that were available during my care of the patient were reviewed by me and considered in my medical decision making (see chart for details).     Brittany Maldonado is a 26 y.o. female with history of asthma and anxiety who presents with nausea, vomiting, epigastric cramping, and fatigue. Patient reports that for the last 3 days, she has had decreased oral intake due to nausea and vomiting. She says that she ate a steak that was in the fridge for several days 30 minutes prior to her symptoms starting. She says that she has been  having abdominal cramping every several minutes in her epigastric area. It is occasionally sharp and severe but it is aching at baseline. She reports no blood in her emesis. She reports feeling dehydrated because she is not able keeping food and fluids down. She denies any constipation or diarrhea. She is still passing gas. She is denying any dysuria but does report urinary frequency. She denies any foul-smelling urine. She is currently finishing her menstrual cycle and denies any vaginal discharge. She describes her abdominal cramping is 8 out of 10 severity in the epigastrium. She denies other locations of discomfort. She denies any recent dramatic injuries. She denies any chest pain, back pain, or symptoms in her extremities. She has no history of liver troubles, pancreas troubles, or previous appendicitis. She denies any lower abdominal pain. She has taken over-the-counter medications without relief.  On exam, patient has tenderness in the epigastric area. No other abdominal tenderness. Lungs are clear. Normal bowel sounds appreciated.  No lower extremity edema. Exam otherwise unremarkable.  Due to patient's symptoms, she will be given pain medicine, nausea medicine, and fluids. Suspect a viral gastritis as cause of symptoms. However, she will have laboratory testing performed to look for dehydration and oriented dysfunction.   Pregnancy test negative. Anticipate reassessment following workup and medications.  PT continued to need IV pain medications and nausea medications.   BP soft in 90's, pt given fluids. Suspect dehydration.   Due to continued pain, decision made to get CT abd/pelvis.   If imaging reassuring, anticipate discharge as symptoms likely 2/2 gastritis.   Care transferred to Dr. Judd Lien while awaiting imaging and lab results.     Final Clinical Impressions(s) / ED Diagnoses   Final diagnoses:  Terminal ileitis without complication (HCC)   Clinical Impression: 1. Terminal  ileitis without complication (HCC)     Disposition: Care transferred to Dr. Judd Lien.     Tegeler, Canary Brim, MD 03/20/17 2004

## 2017-03-20 NOTE — ED Triage Notes (Signed)
Pt c/o epigastric pain x 36 hrs; reports able to eat, but vomits "bile" about an hour after.

## 2017-03-20 NOTE — ED Notes (Signed)
ED Provider at bedside. 

## 2017-09-29 ENCOUNTER — Emergency Department (HOSPITAL_BASED_OUTPATIENT_CLINIC_OR_DEPARTMENT_OTHER)
Admission: EM | Admit: 2017-09-29 | Discharge: 2017-09-29 | Disposition: A | Discharge status: Home / Self Care | Payer: Medicaid Other | Attending: Emergency Medicine | Admitting: Emergency Medicine

## 2017-09-29 ENCOUNTER — Encounter (HOSPITAL_BASED_OUTPATIENT_CLINIC_OR_DEPARTMENT_OTHER): Payer: Self-pay

## 2017-09-29 ENCOUNTER — Emergency Department (HOSPITAL_BASED_OUTPATIENT_CLINIC_OR_DEPARTMENT_OTHER): Payer: Medicaid Other

## 2017-09-29 ENCOUNTER — Other Ambulatory Visit: Payer: Self-pay

## 2017-09-29 DIAGNOSIS — J45909 Unspecified asthma, uncomplicated: Secondary | ICD-10-CM | POA: Diagnosis not present

## 2017-09-29 DIAGNOSIS — R300 Dysuria: Secondary | ICD-10-CM | POA: Insufficient documentation

## 2017-09-29 DIAGNOSIS — R1031 Right lower quadrant pain: Secondary | ICD-10-CM

## 2017-09-29 DIAGNOSIS — Z79899 Other long term (current) drug therapy: Secondary | ICD-10-CM | POA: Diagnosis not present

## 2017-09-29 DIAGNOSIS — F17210 Nicotine dependence, cigarettes, uncomplicated: Secondary | ICD-10-CM | POA: Insufficient documentation

## 2017-09-29 LAB — URINALYSIS, ROUTINE W REFLEX MICROSCOPIC
Bilirubin Urine: NEGATIVE
Glucose, UA: NEGATIVE mg/dL
Ketones, ur: NEGATIVE mg/dL
Nitrite: NEGATIVE
Protein, ur: NEGATIVE mg/dL
Specific Gravity, Urine: 1.015 (ref 1.005–1.030)
pH: 7 (ref 5.0–8.0)

## 2017-09-29 LAB — CBC WITH DIFFERENTIAL/PLATELET
Basophils Absolute: 0 10*3/uL (ref 0.0–0.1)
Basophils Relative: 0 %
Eosinophils Absolute: 0.1 10*3/uL (ref 0.0–0.7)
Eosinophils Relative: 1 %
HCT: 44.7 % (ref 36.0–46.0)
HEMOGLOBIN: 15.3 g/dL — AB (ref 12.0–15.0)
LYMPHS ABS: 1.1 10*3/uL (ref 0.7–4.0)
Lymphocytes Relative: 14 %
MCH: 29.4 pg (ref 26.0–34.0)
MCHC: 34.2 g/dL (ref 30.0–36.0)
MCV: 86 fL (ref 78.0–100.0)
MONOS PCT: 4 %
Monocytes Absolute: 0.3 10*3/uL (ref 0.1–1.0)
NEUTROS ABS: 6.2 10*3/uL (ref 1.7–7.7)
NEUTROS PCT: 81 %
PLATELETS: 255 10*3/uL (ref 150–400)
RBC: 5.2 MIL/uL — AB (ref 3.87–5.11)
RDW: 12.9 % (ref 11.5–15.5)
WBC: 7.7 10*3/uL (ref 4.0–10.5)

## 2017-09-29 LAB — PREGNANCY, URINE: Preg Test, Ur: NEGATIVE

## 2017-09-29 LAB — URINALYSIS, MICROSCOPIC (REFLEX)

## 2017-09-29 LAB — BASIC METABOLIC PANEL
ANION GAP: 9 (ref 5–15)
BUN: 12 mg/dL (ref 6–20)
CALCIUM: 8.7 mg/dL — AB (ref 8.9–10.3)
CO2: 23 mmol/L (ref 22–32)
Chloride: 102 mmol/L (ref 101–111)
Creatinine, Ser: 0.62 mg/dL (ref 0.44–1.00)
GLUCOSE: 97 mg/dL (ref 65–99)
Potassium: 3.4 mmol/L — ABNORMAL LOW (ref 3.5–5.1)
SODIUM: 134 mmol/L — AB (ref 135–145)

## 2017-09-29 MED ORDER — IOPAMIDOL (ISOVUE-300) INJECTION 61%
100.0000 mL | Freq: Once | INTRAVENOUS | Status: AC | PRN
  Administered 2017-09-29: 100 mL via INTRAVENOUS

## 2017-09-29 MED ORDER — SODIUM CHLORIDE 0.9 % IV SOLN
Freq: Once | INTRAVENOUS | Status: AC
  Administered 2017-09-29: 02:00:00 via INTRAVENOUS

## 2017-09-29 MED ORDER — ONDANSETRON HCL 4 MG/2ML IJ SOLN
4.0000 mg | Freq: Once | INTRAMUSCULAR | Status: AC
Start: 2017-09-29 — End: 2017-09-29
  Administered 2017-09-29: 4 mg via INTRAVENOUS
  Filled 2017-09-29: qty 2

## 2017-09-29 MED ORDER — FENTANYL CITRATE (PF) 100 MCG/2ML IJ SOLN
50.0000 ug | Freq: Once | INTRAMUSCULAR | Status: AC
  Administered 2017-09-29: 50 ug via INTRAVENOUS
  Filled 2017-09-29: qty 2

## 2017-09-29 NOTE — ED Notes (Signed)
Patient transported to CT 

## 2017-09-29 NOTE — ED Triage Notes (Signed)
Pt c/o fever yesterday as high as 101.8 at home, then right flank pain and RLQ pain today with urinary frequency and a weak urine stream.  Pt took 650 mg tylenol around 2300.

## 2017-09-29 NOTE — ED Provider Notes (Signed)
[facility] Provider Note: Lowella Dell, MD, FACEP  CSN: [id number] MRN: [id number] ARRIVAL: 09/29/17 at 0034 ROOM: [facility]   CHIEF COMPLAINT  Abdominal Pain   HISTORY OF PRESENT ILLNESS  09/29/17 1:23 AM Brittany Maldonado is a 27 y.o. female with a one-week history of urinary frequency and voiding small amounts.  She denies burning with urination.  She is here now with a 1 day history of right lower quadrant abdominal pain as well as right back pain.  She rates her pain as a 7 out of 10, worse with movement or palpation.  The back pain is also exacerbated by lying supine.  The onset was somewhat abrupt and the pain has gradually worsened. She has had fever as high as 101.8 which improved after taking Tylenol yesterday evening about 11 PM.  She denies nausea, vomiting or diarrhea.  She denies vaginal bleeding or discharge.  She has had little appetite.   Past Medical History:  Diagnosis Date  . Anxiety   . Asthma    exercise induced  . Depression    good  right now    Past Surgical History:  Procedure Laterality Date  . RHINOPLASTY     reset broken nose    Family History  Problem Relation Age of Onset  . COPD Father   . Stroke Maternal Grandfather   . Cancer Paternal Grandmother        lung    Social History   Tobacco Use  . Smoking status: Current Every Day Smoker    Years: 9.00    Types: Cigarettes    Last attempt to quit: 02/05/2017    Years since quitting: 0.6  . Smokeless tobacco: Never Used  Substance Use Topics  . Alcohol use: No  . Drug use: No    Prior to Admission medications   Medication Sig Start Date End Date Taking? Authorizing Provider  ciprofloxacin (CIPRO) 500 MG tablet Take 1 tablet (500 mg total) by mouth 2 (two) times daily. One po bid x 7 days 03/20/17   Geoffery Lyons, MD  HYDROcodone-acetaminophen (NORCO/VICODIN) 5-325 MG tablet Take 1-2 tablets by mouth every 6 (six) hours as needed. 03/20/17   Geoffery Lyons, MD    HYDROmorphone (DILAUDID) 2 MG tablet Take 1 tablet (2 mg total) by mouth every 12 (twelve) hours as needed for severe pain. 02/09/17 02/09/18  Marylene Land, CNM  ibuprofen (ADVIL,MOTRIN) 800 MG tablet Take 1 tablet (800 mg total) by mouth 3 (three) times daily. 03/08/17   Palumbo, April, MD  metroNIDAZOLE (FLAGYL) 500 MG tablet Take 1 tablet (500 mg total) by mouth 3 (three) times daily. One po bid x 7 days 03/20/17   Geoffery Lyons, MD  promethazine (PHENERGAN) 25 MG tablet Take 1 tablet (25 mg total) by mouth every 6 (six) hours as needed for nausea or vomiting. 02/06/17   Judeth Horn, NP    Allergies Patient has no known allergies.   REVIEW OF SYSTEMS  Negative except as noted here or in the History of Present Illness.   PHYSICAL EXAMINATION  Initial Vital Signs Blood pressure 97/85, pulse (!) 107, temperature 99 F (37.2 C), temperature source Oral, resp. rate 20, height 5\' 3"  (1.6 m), weight 88.5 kg (195 lb), last menstrual period 09/05/2017, SpO2 100 %.  Examination General: Well-developed, well-nourished female in no acute distress; appearance consistent with age of record HENT: normocephalic; atraumatic Eyes: pupils equal, round and reactive to light; extraocular muscles intact Neck: supple Heart: regular rate and  rhythm Lungs: clear to auscultation bilaterally Abdomen: soft; nondistended; right lower quadrant tenderness at about McBurney's point; no masses or hepatosplenomegaly; bowel sounds present Back: Right paralumbar tenderness Extremities: No deformity; full range of motion; pulses normal Neurologic: Awake, alert and oriented; motor function intact in all extremities and symmetric; no facial droop Skin: Warm and dry Psychiatric: Normal mood and affect   RESULTS  Summary of this visit's results, reviewed by myself:   EKG Interpretation  Date/Time:    Ventricular Rate:    PR Interval:    QRS Duration:   QT Interval:    QTC Calculation:   R Axis:      Text Interpretation:        Laboratory Studies: Results for orders placed or performed during the hospital encounter of 09/29/17 (from the past 24 hour(s))  Urinalysis, Routine w reflex microscopic     Status: Abnormal   Collection Time: 09/29/17 12:34 AM  Result Value Ref Range   Color, Urine YELLOW YELLOW   APPearance CLEAR CLEAR   Specific Gravity, Urine 1.015 1.005 - 1.030   pH 7.0 5.0 - 8.0   Glucose, UA NEGATIVE NEGATIVE mg/dL   Hgb urine dipstick TRACE (A) NEGATIVE   Bilirubin Urine NEGATIVE NEGATIVE   Ketones, ur NEGATIVE NEGATIVE mg/dL   Protein, ur NEGATIVE NEGATIVE mg/dL   Nitrite NEGATIVE NEGATIVE   Leukocytes, UA TRACE (A) NEGATIVE  Pregnancy, urine     Status: None   Collection Time: 09/29/17 12:34 AM  Result Value Ref Range   Preg Test, Ur NEGATIVE NEGATIVE  Urinalysis, Microscopic (reflex)     Status: Abnormal   Collection Time: 09/29/17 12:34 AM  Result Value Ref Range   RBC / HPF 0-5 0 - 5 RBC/hpf   WBC, UA 0-5 0 - 5 WBC/hpf   Bacteria, UA FEW (A) NONE SEEN   Squamous Epithelial / LPF 0-5 (A) NONE SEEN  CBC with Differential/Platelet     Status: Abnormal   Collection Time: 09/29/17  1:43 AM  Result Value Ref Range   WBC 7.7 4.0 - 10.5 K/uL   RBC 5.20 (H) 3.87 - 5.11 MIL/uL   Hemoglobin 15.3 (H) 12.0 - 15.0 g/dL   HCT 60.4 54.0 - 98.1 %   MCV 86.0 78.0 - 100.0 fL   MCH 29.4 26.0 - 34.0 pg   MCHC 34.2 30.0 - 36.0 g/dL   RDW 19.1 47.8 - 29.5 %   Platelets 255 150 - 400 K/uL   Neutrophils Relative % 81 %   Neutro Abs 6.2 1.7 - 7.7 K/uL   Lymphocytes Relative 14 %   Lymphs Abs 1.1 0.7 - 4.0 K/uL   Monocytes Relative 4 %   Monocytes Absolute 0.3 0.1 - 1.0 K/uL   Eosinophils Relative 1 %   Eosinophils Absolute 0.1 0.0 - 0.7 K/uL   Basophils Relative 0 %   Basophils Absolute 0.0 0.0 - 0.1 K/uL  Basic metabolic panel     Status: Abnormal   Collection Time: 09/29/17  1:43 AM  Result Value Ref Range   Sodium 134 (L) 135 - 145 mmol/L   Potassium 3.4  (L) 3.5 - 5.1 mmol/L   Chloride 102 101 - 111 mmol/L   CO2 23 22 - 32 mmol/L   Glucose, Bld 97 65 - 99 mg/dL   BUN 12 6 - 20 mg/dL   Creatinine, Ser 6.21 0.44 - 1.00 mg/dL   Calcium 8.7 (L) 8.9 - 10.3 mg/dL   GFR calc non Af Amer >60 >  60 mL/min   GFR calc Af Amer >60 >60 mL/min   Anion gap 9 5 - 15   Imaging Studies: Ct Abdomen Pelvis W Contrast  Result Date: 09/29/2017 CLINICAL DATA:  Right lower quadrant abdominal pain EXAM: CT ABDOMEN AND PELVIS WITH CONTRAST TECHNIQUE: Multidetector CT imaging of the abdomen and pelvis was performed using the standard protocol following bolus administration of intravenous contrast. CONTRAST:  100mL ISOVUE-300 IOPAMIDOL (ISOVUE-300) INJECTION 61% COMPARISON:  CT 03/20/2017 FINDINGS: Lower chest: No acute abnormality. Hepatobiliary: No focal liver abnormality is seen. No gallstones, gallbladder wall thickening, or biliary dilatation. Pancreas: Unremarkable. No pancreatic ductal dilatation or surrounding inflammatory changes. Spleen: Normal in size without focal abnormality. Adrenals/Urinary Tract: Adrenal glands are unremarkable. Kidneys are normal, without renal calculi, focal lesion, or hydronephrosis. Bladder is unremarkable. Stomach/Bowel: Stomach is within normal limits. Appendix appears normal. No evidence of bowel wall thickening, distention, or inflammatory changes. Vascular/Lymphatic: No significant vascular findings are present. No enlarged abdominal or pelvic lymph nodes. Reproductive: Uterus and bilateral adnexa are unremarkable. Other: No abdominal wall hernia or abnormality. No abdominopelvic ascites. Musculoskeletal: No acute or significant osseous findings. IMPRESSION: Negative. No CT evidence for acute intra-abdominal or pelvic abnormality. No CT evidence for appendicitis. Electronically Signed   By: Jasmine PangKim  Fujinaga M.D.   On: 09/29/2017 03:05    ED COURSE  Nursing notes and initial vitals signs, including pulse oximetry, reviewed.  Vitals:    09/29/17 0045  BP: 97/85  Pulse: (!) 107  Resp: 20  Temp: 99 F (37.2 C)  TempSrc: Oral  SpO2: 100%  Weight: 88.5 kg (195 lb)  Height: 5\' 3"  (1.6 m)   3:21 AM Patient advised of reassuring lab work and CT scan.  She was advised that she can take ibuprofen or Aleve as well as Tylenol for her pain and fever.  She was advised to return if symptoms are worsening as we cannot completely rule out an early intra-abdominal process.  PROCEDURES    ED DIAGNOSES     ICD-10-CM   1. Right lower quadrant abdominal pain R10.31        Raelie Lohr, Jonny RuizJohn, MD 09/29/17 [phone]

## 2017-11-05 IMAGING — US US OB COMP LESS 14 WK
1 series · 13 of 28 positions shown · non-contrast
Comparison: None.

CLINICAL DATA: Acute onset of pelvic pain.  Initial encounter.

EXAM:
OBSTETRIC <14 WK US AND TRANSVAGINAL OB US
TECHNIQUE: Both transabdominal and transvaginal ultrasound examinations were
performed for complete evaluation of the gestation as well as the
maternal uterus, adnexal regions, and pelvic cul-de-sac.
Transvaginal technique was performed to assess early pregnancy.

[Series 1: us ob comp less 14 wk · 0.23mm/px · 13 of 75 slices shown]
[im 3/75]
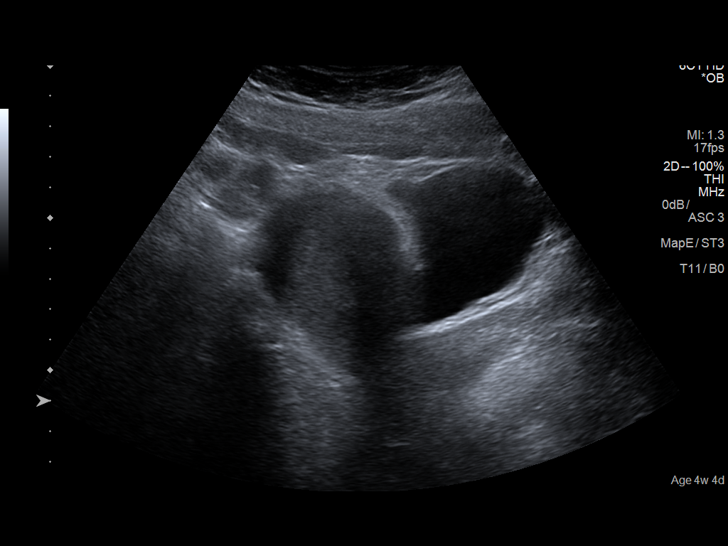
[im 9/75]
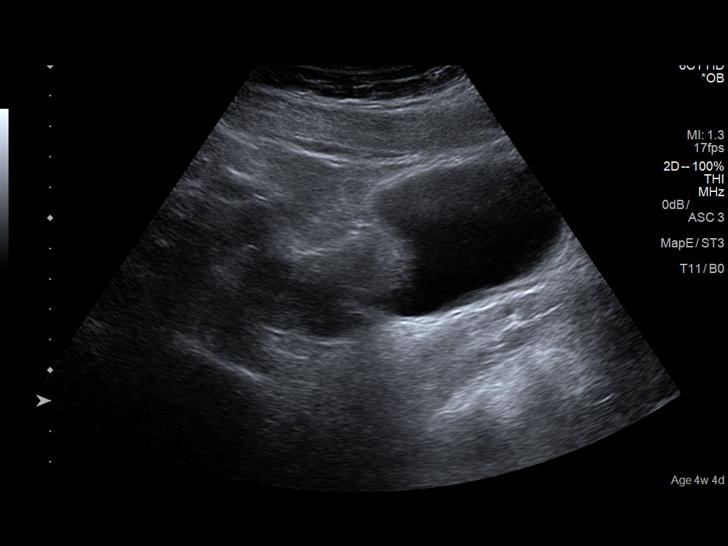
[im 14/75]
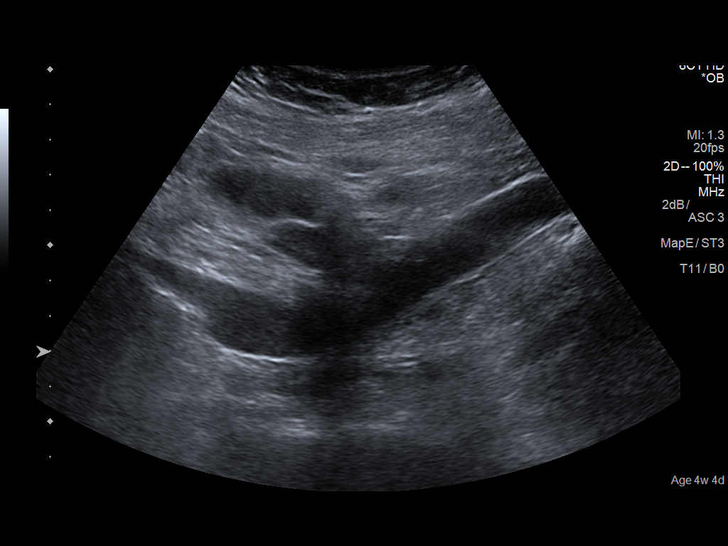
[im 20/75]
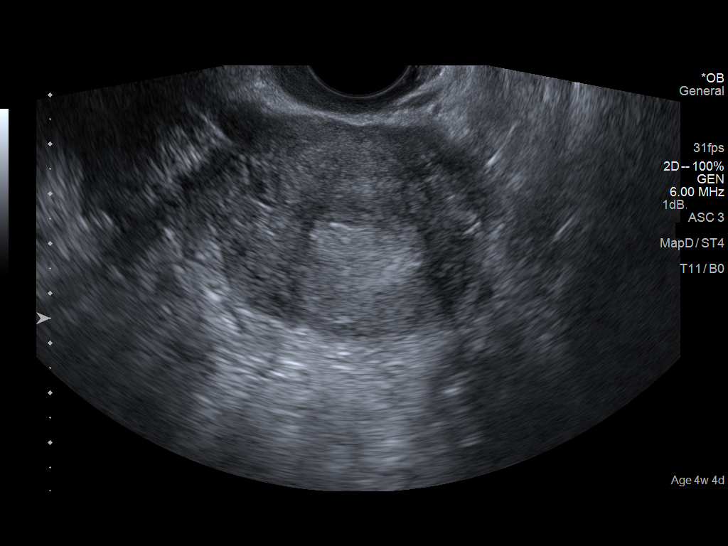
[im 25/75]
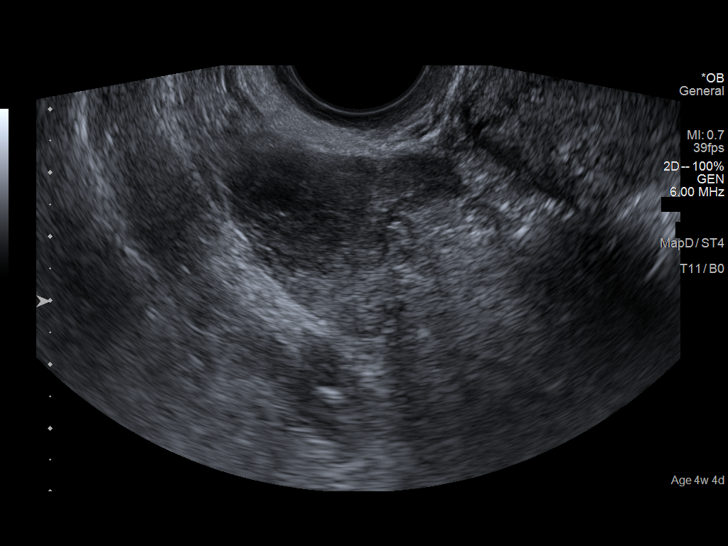
[im 31/75]
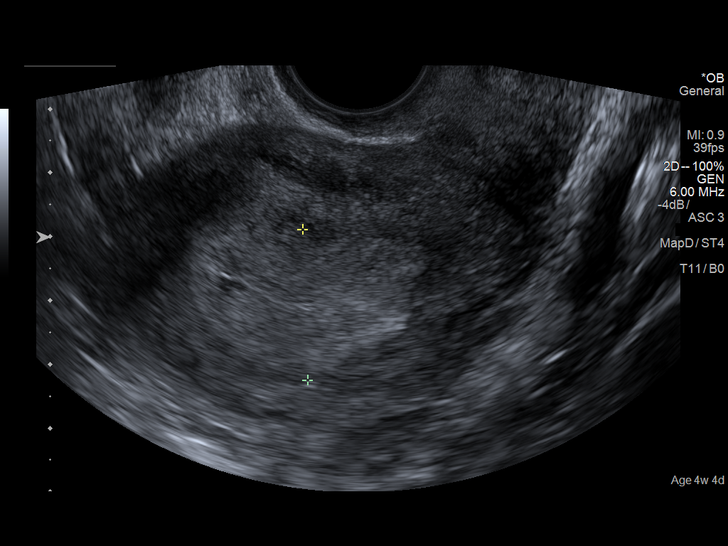
[im 39/75]
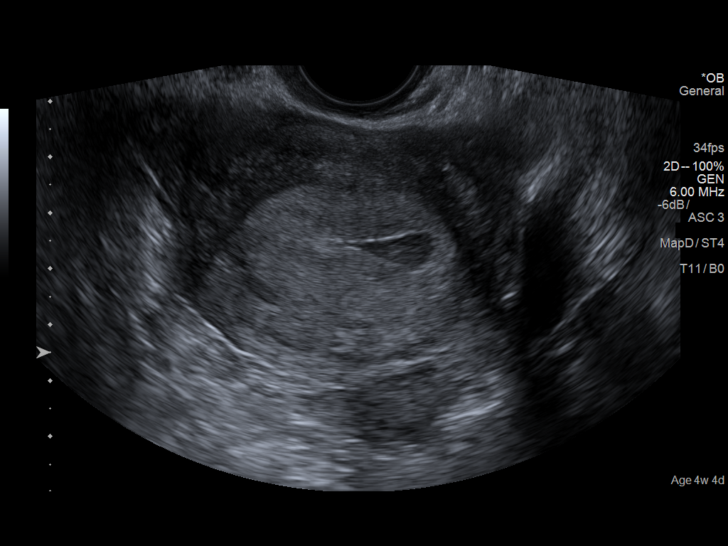
[im 44/75]
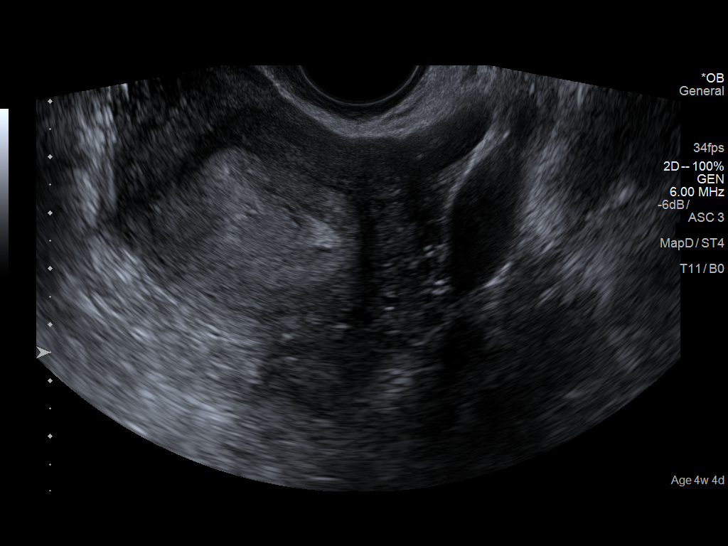
[im 50/75]
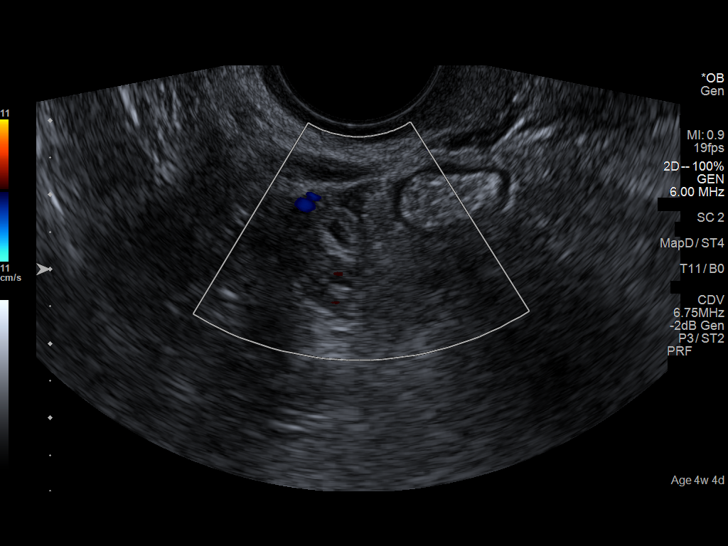
[im 55/75]
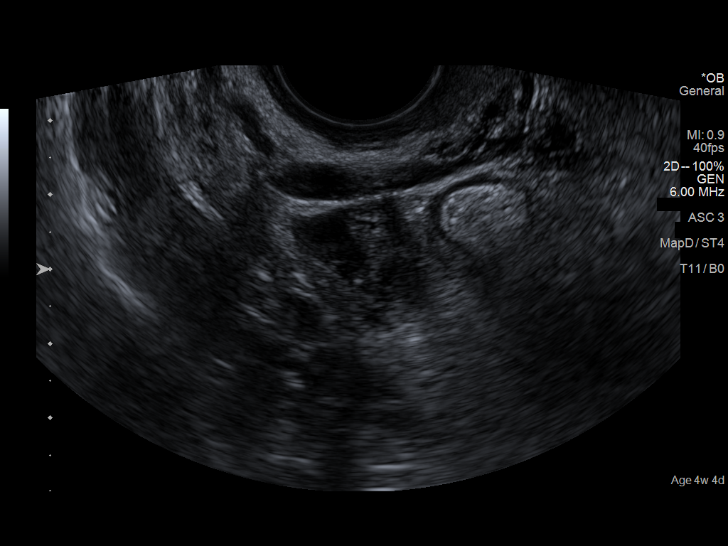
[im 61/75]
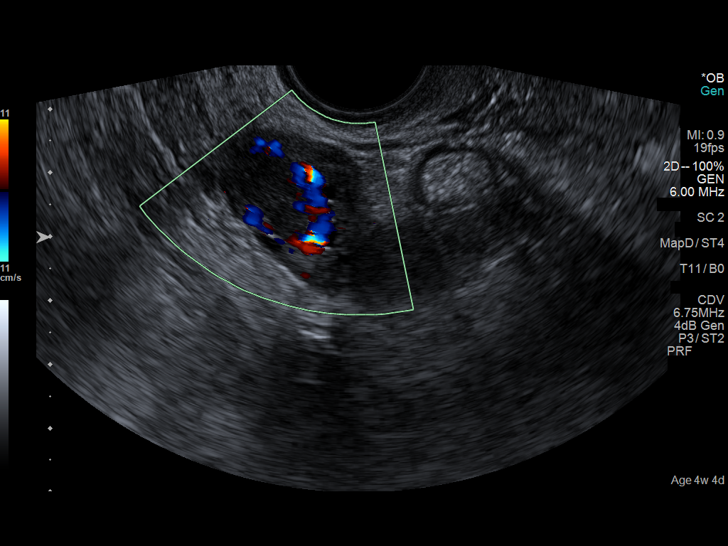
[im 66/75]
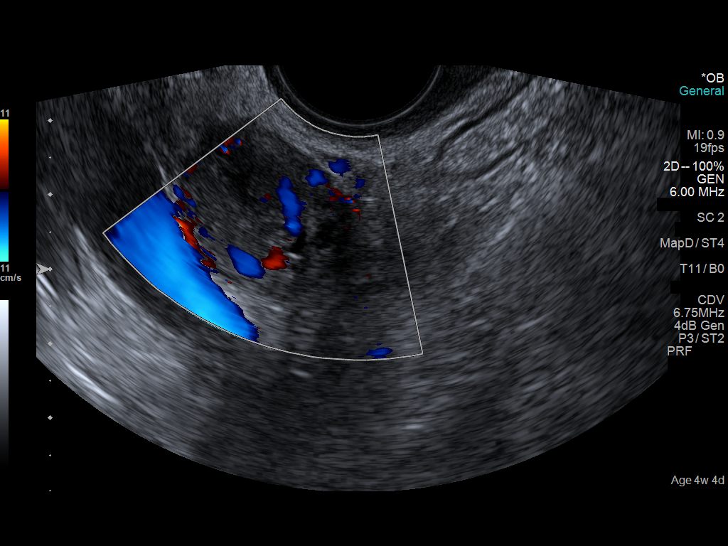
[im 72/75]
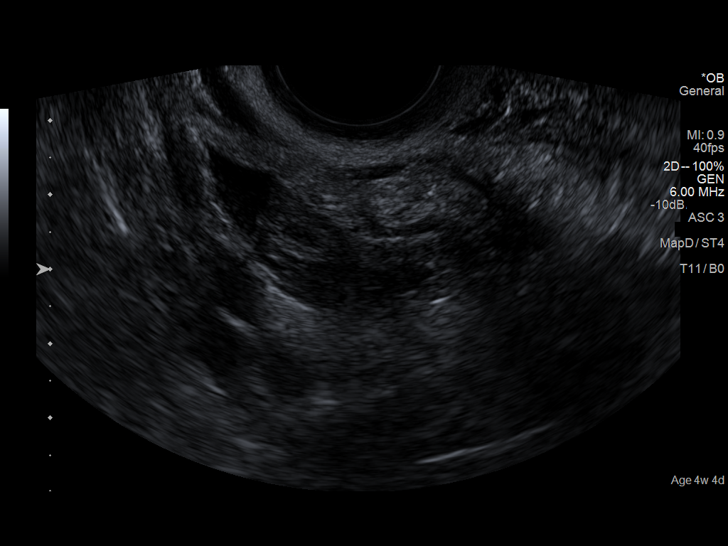

[13 of 28 positions shown; findings below may reference images not displayed]

FINDINGS: Intrauterine gestational sac: None seen.

Yolk sac:  N/A

Embryo:  N/A

Subchorionic hemorrhage:  None visualized.

Maternal uterus/adnexae: A small amount of complex fluid is noted
within the endometrial canal.

There appears to be a small 0.9 cm complex lesion with a central
cyst medial to the right ovary, raising suspicion for ectopic
pregnancy.

The ovaries are unremarkable in appearance. The right ovary measures
3.3 x 2.4 x 2.5 cm, with a corpus luteal cyst, while the left ovary
measures 2.7 x 1.7 x 2.4 cm.

A small amount of free fluid is noted at the right adnexa.
IMPRESSION: 1. Apparent small 0.9 cm complex lesion with a central cyst noted
medial to the right ovary, suspicious for ectopic pregnancy.
Associated small amount of free fluid at the right adnexa.
2. No evidence for intrauterine pregnancy at this time.
3. Small amount of complex fluid noted within the endometrial canal.
These results were called by telephone at the time of interpretation
on 02/06/2017 at [DATE] to REESE TIGER PA, who verbally
acknowledged these results.

## 2018-06-26 ENCOUNTER — Emergency Department (HOSPITAL_BASED_OUTPATIENT_CLINIC_OR_DEPARTMENT_OTHER)
Admission: EM | Admit: 2018-06-26 | Discharge: 2018-06-26 | Disposition: A | Discharge status: Home / Self Care | Payer: Medicaid Other | Attending: Emergency Medicine | Admitting: Emergency Medicine

## 2018-06-26 ENCOUNTER — Other Ambulatory Visit: Payer: Self-pay

## 2018-06-26 ENCOUNTER — Emergency Department (HOSPITAL_BASED_OUTPATIENT_CLINIC_OR_DEPARTMENT_OTHER): Payer: Medicaid Other

## 2018-06-26 ENCOUNTER — Encounter (HOSPITAL_BASED_OUTPATIENT_CLINIC_OR_DEPARTMENT_OTHER): Payer: Self-pay | Admitting: *Deleted

## 2018-06-26 DIAGNOSIS — F329 Major depressive disorder, single episode, unspecified: Secondary | ICD-10-CM | POA: Diagnosis not present

## 2018-06-26 DIAGNOSIS — F17210 Nicotine dependence, cigarettes, uncomplicated: Secondary | ICD-10-CM | POA: Insufficient documentation

## 2018-06-26 DIAGNOSIS — F419 Anxiety disorder, unspecified: Secondary | ICD-10-CM | POA: Insufficient documentation

## 2018-06-26 DIAGNOSIS — J069 Acute upper respiratory infection, unspecified: Secondary | ICD-10-CM

## 2018-06-26 DIAGNOSIS — R05 Cough: Secondary | ICD-10-CM | POA: Diagnosis present

## 2018-06-26 DIAGNOSIS — J45909 Unspecified asthma, uncomplicated: Secondary | ICD-10-CM | POA: Diagnosis not present

## 2018-06-26 DIAGNOSIS — B9789 Other viral agents as the cause of diseases classified elsewhere: Secondary | ICD-10-CM

## 2018-06-26 MED ORDER — IPRATROPIUM-ALBUTEROL 0.5-2.5 (3) MG/3ML IN SOLN
3.0000 mL | Freq: Four times a day (QID) | RESPIRATORY_TRACT | Status: DC
  Administered 2018-06-26: 3 mL via RESPIRATORY_TRACT
  Filled 2018-06-26: qty 3

## 2018-06-26 MED ORDER — BENZONATATE 100 MG PO CAPS: 100.0000 mg | ORAL_CAPSULE | Freq: Three times a day (TID) | ORAL | 0 refills | Status: DC

## 2018-06-26 MED ORDER — PREDNISONE 50 MG PO TABS: 50.0000 mg | ORAL_TABLET | Freq: Every day | ORAL | 0 refills | Status: DC

## 2018-06-26 NOTE — ED Triage Notes (Signed)
URI x2 days.

## 2018-06-26 NOTE — ED Provider Notes (Signed)
[facility] EMERGENCY DEPARTMENT Provider Note   CSN: [id number] Arrival date & time: 06/26/18  1149     History   Chief Complaint Chief Complaint  Patient presents with  . URI    HPI Brittany Maldonado is a 27 y.o. female.  HPI Brittany Maldonado presents to the emergency room for evaluation of cough and congestion.  Patient states she started having cough and congestion a couple days ago.  Symptoms seem to be increasing in severity and her doctor's office is not open today so she came to the ED.  She has a history of asthma and she smokes and uses inhaler intermittently.  She has been using that over the last couple of days.  She denies any fevers or chills.  No chest pain.  No leg swelling. Past Medical History:  Diagnosis Date  . Anxiety   . Asthma    exercise induced  . Depression    good  right now    There are no active problems to display for this patient.   Past Surgical History:  Procedure Laterality Date  . RHINOPLASTY     reset broken nose     OB History    Gravida  3   Para  1   Term  1   Preterm  0   AB  1   Living  1     SAB      TAB  1   Ectopic      Multiple      Live Births  1            Home Medications    Prior to Admission medications   Medication Sig Start Date End Date Taking? Authorizing Provider  benzonatate (TESSALON) 100 MG capsule Take 1 capsule (100 mg total) by mouth every 8 (eight) hours. 06/26/18   Linwood Dibbles, MD  predniSONE (DELTASONE) 50 MG tablet Take 1 tablet (50 mg total) by mouth daily. 06/26/18   Linwood Dibbles, MD    Family History Family History  Problem Relation Age of Onset  . COPD Father   . Stroke Maternal Grandfather   . Cancer Paternal Grandmother        lung    Social History Social History   Tobacco Use  . Smoking status: Current Every Day Smoker    Years: 9.00    Types: Cigarettes    Last attempt to quit: 02/05/2017    Years since quitting: 1.3  . Smokeless tobacco: Never Used    Substance Use Topics  . Alcohol use: No  . Drug use: No     Allergies   Patient has no known allergies.   Review of Systems Review of Systems  All other systems reviewed and are negative.    Physical Exam Updated Vital Signs BP 122/72 (BP Location: Left Arm)   Pulse 94   Temp 98.3 F (36.8 C) (Oral)   Resp 18   Ht 1.6 m (5\' 3" )   Wt 79.4 kg   LMP 06/12/2018   SpO2 100%   BMI 31.00 kg/m   Physical Exam  Constitutional: She appears well-developed and well-nourished. No distress.  HENT:  Head: Normocephalic and atraumatic.  Right Ear: External ear normal.  Left Ear: External ear normal.  Mouth/Throat: No oropharyngeal exudate.  Eyes: Conjunctivae and EOM are normal. Right eye exhibits no discharge. Left eye exhibits no discharge. No scleral icterus.  Neck: Neck supple. No tracheal deviation present.  Cardiovascular: Normal rate, regular rhythm and  intact distal pulses.  Pulmonary/Chest: Effort normal and breath sounds normal. No stridor. No respiratory distress. She has no wheezes. She has no rales.  Abdominal: Soft. Bowel sounds are normal. She exhibits no distension. There is no tenderness. There is no rebound and no guarding.  Musculoskeletal: She exhibits no edema or tenderness.  Neurological: She is alert. She has normal strength. No cranial nerve deficit (no facial droop, extraocular movements intact, no slurred speech) or sensory deficit. She exhibits normal muscle tone. She displays no seizure activity. Coordination normal.  Skin: Skin is warm and dry. No rash noted.  Psychiatric: She has a normal mood and affect.  Nursing note and vitals reviewed.    ED Treatments / Results  Labs (all labs ordered are listed, but only abnormal results are displayed) Labs Reviewed - No data to display  EKG None  Radiology Dg Chest 2 View  Result Date: 06/26/2018 CLINICAL DATA:  Chest pain shortness of breath for 3 days. Intermittent fever. EXAM: CHEST - 2 VIEW  COMPARISON:  None. FINDINGS: The heart size and mediastinal contours are within normal limits. Both lungs are clear. The visualized skeletal structures are unremarkable. IMPRESSION: No active cardiopulmonary disease. Electronically Signed   By: Marin Roberts M.D.   On: 06/26/2018 13:09    Procedures Procedures (including critical care time)  Medications Ordered in ED Medications  ipratropium-albuterol (DUONEB) 0.5-2.5 (3) MG/3ML nebulizer solution 3 mL (3 mLs Nebulization Given 06/26/18 1215)     Initial Impression / Assessment and Plan / ED Course  I have reviewed the triage vital signs and the nursing notes.  Pertinent labs & imaging results that were available during my care of the patient were reviewed by me and considered in my medical decision making (see chart for details).  Clinical Course as of Jun 26 1332  Wynelle Link Jun 26, 2018  1332 No pneumonia   [JK]    Clinical Course User Index [JK] Linwood Dibbles, MD  Patient presents with cough and congestion.  Vital signs are stable.  No wheezing noted on exam.  Chest x-ray without pneumonia.  Patient had some symptomatic relief with a breathing treatment.  Recommend she continue her albuterol inhaler as needed.  I will give her prescription for prednisone and Tessalon to help with the cough.  Final Clinical Impressions(s) / ED Diagnoses   Final diagnoses:  Viral URI with cough    ED Discharge Orders         Ordered    predniSONE (DELTASONE) 50 MG tablet  Daily     06/26/18 1331    benzonatate (TESSALON) 100 MG capsule  Every 8 hours     06/26/18 1331           Linwood Dibbles, MD 06/26/18 1333

## 2018-06-26 NOTE — Discharge Instructions (Addendum)
Use your  inhaler and take the medications as prescribed, return to the emergency room for worsening symptoms including shortness of breath for high fevers

## 2018-07-25 ENCOUNTER — Emergency Department (HOSPITAL_BASED_OUTPATIENT_CLINIC_OR_DEPARTMENT_OTHER): Payer: Medicaid Other

## 2018-07-25 ENCOUNTER — Encounter (HOSPITAL_BASED_OUTPATIENT_CLINIC_OR_DEPARTMENT_OTHER): Payer: Self-pay | Admitting: *Deleted

## 2018-07-25 ENCOUNTER — Emergency Department (HOSPITAL_BASED_OUTPATIENT_CLINIC_OR_DEPARTMENT_OTHER)
Admission: EM | Admit: 2018-07-25 | Discharge: 2018-07-26 | Disposition: A | Discharge status: Home / Self Care | Payer: Medicaid Other | Attending: Emergency Medicine | Admitting: Emergency Medicine

## 2018-07-25 ENCOUNTER — Other Ambulatory Visit: Payer: Self-pay

## 2018-07-25 DIAGNOSIS — F419 Anxiety disorder, unspecified: Secondary | ICD-10-CM | POA: Insufficient documentation

## 2018-07-25 DIAGNOSIS — J45909 Unspecified asthma, uncomplicated: Secondary | ICD-10-CM | POA: Diagnosis not present

## 2018-07-25 DIAGNOSIS — Z79899 Other long term (current) drug therapy: Secondary | ICD-10-CM | POA: Diagnosis not present

## 2018-07-25 DIAGNOSIS — B9689 Other specified bacterial agents as the cause of diseases classified elsewhere: Secondary | ICD-10-CM | POA: Diagnosis not present

## 2018-07-25 DIAGNOSIS — R1031 Right lower quadrant pain: Secondary | ICD-10-CM

## 2018-07-25 DIAGNOSIS — F17210 Nicotine dependence, cigarettes, uncomplicated: Secondary | ICD-10-CM | POA: Insufficient documentation

## 2018-07-25 DIAGNOSIS — N760 Acute vaginitis: Secondary | ICD-10-CM | POA: Diagnosis not present

## 2018-07-25 LAB — COMPREHENSIVE METABOLIC PANEL
ALK PHOS: 54 U/L (ref 38–126)
ALT: 9 U/L (ref 0–44)
AST: 12 U/L — AB (ref 15–41)
Albumin: 3.7 g/dL (ref 3.5–5.0)
Anion gap: 12 (ref 5–15)
BUN: 9 mg/dL (ref 6–20)
CALCIUM: 9.2 mg/dL (ref 8.9–10.3)
CO2: 22 mmol/L (ref 22–32)
CREATININE: 0.59 mg/dL (ref 0.44–1.00)
Chloride: 104 mmol/L (ref 98–111)
Glucose, Bld: 88 mg/dL (ref 70–99)
Potassium: 3.5 mmol/L (ref 3.5–5.1)
Sodium: 138 mmol/L (ref 135–145)
Total Bilirubin: 0.2 mg/dL — ABNORMAL LOW (ref 0.3–1.2)
Total Protein: 6.9 g/dL (ref 6.5–8.1)

## 2018-07-25 LAB — CBC
HCT: 43.4 % (ref 36.0–46.0)
Hemoglobin: 14.3 g/dL (ref 12.0–15.0)
MCH: 29.5 pg (ref 26.0–34.0)
MCHC: 32.9 g/dL (ref 30.0–36.0)
MCV: 89.7 fL (ref 80.0–100.0)
NRBC: 0 % (ref 0.0–0.2)
PLATELETS: 319 10*3/uL (ref 150–400)
RBC: 4.84 MIL/uL (ref 3.87–5.11)
RDW: 12.9 % (ref 11.5–15.5)
WBC: 10.8 10*3/uL — AB (ref 4.0–10.5)

## 2018-07-25 LAB — URINALYSIS, ROUTINE W REFLEX MICROSCOPIC
BILIRUBIN URINE: NEGATIVE
GLUCOSE, UA: NEGATIVE mg/dL
Ketones, ur: NEGATIVE mg/dL
Nitrite: NEGATIVE
PROTEIN: NEGATIVE mg/dL
Specific Gravity, Urine: <1.005 — ABNORMAL LOW (ref 1.005–1.030)
pH: 6.5 (ref 5.0–8.0)

## 2018-07-25 LAB — URINALYSIS, MICROSCOPIC (REFLEX)

## 2018-07-25 LAB — WET PREP, GENITAL
Sperm: NONE SEEN
Trich, Wet Prep: NONE SEEN
Yeast Wet Prep HPF POC: NONE SEEN

## 2018-07-25 LAB — LIPASE, BLOOD: LIPASE: 41 U/L (ref 11–51)

## 2018-07-25 LAB — PREGNANCY, URINE: PREG TEST UR: NEGATIVE

## 2018-07-25 MED ORDER — MORPHINE SULFATE (PF) 4 MG/ML IV SOLN
4.0000 mg | Freq: Once | INTRAVENOUS | Status: AC
  Administered 2018-07-25: 4 mg via INTRAVENOUS
  Filled 2018-07-25: qty 1

## 2018-07-25 MED ORDER — SODIUM CHLORIDE 0.9 % IV BOLUS
1000.0000 mL | Freq: Once | INTRAVENOUS | Status: AC
  Administered 2018-07-25: 1000 mL via INTRAVENOUS

## 2018-07-25 NOTE — ED Triage Notes (Signed)
Abdominal pain x 5 days. Bloating. Right lower quadrant tenderness.

## 2018-07-25 NOTE — ED Provider Notes (Signed)
[facility] EMERGENCY DEPARTMENT Provider Note   CSN: [id number] Arrival date & time: 07/25/18  2029     History   Chief Complaint Chief Complaint  Patient presents with  . Abdominal Pain    HPI Brittany Maldonado is a 27 y.o. female with a past medical history of anxiety, depression, who resents today for evaluation of right lower quadrant abdominal pain for 5 days.  She reports that her pain has consistently been in the right lower quadrant.  She has never had any previous abdominal surgeries.  She denies any trauma.  She denies any nausea vomiting or diarrhea.  She does report pain with urination.  She reports increased vaginal discharge.    She also reports feeling bloated.  No fevers at home.  HPI  Past Medical History:  Diagnosis Date  . Anxiety   . Asthma    exercise induced  . Depression    good  right now    There are no active problems to display for this patient.   Past Surgical History:  Procedure Laterality Date  . RHINOPLASTY     reset broken nose     OB History    Gravida  3   Para  1   Term  1   Preterm  0   AB  1   Living  1     SAB      TAB  1   Ectopic      Multiple      Live Births  1            Home Medications    Prior to Admission medications   Medication Sig Start Date End Date Taking? Authorizing Provider  benzonatate (TESSALON) 100 MG capsule Take 1 capsule (100 mg total) by mouth every 8 (eight) hours. 06/26/18   Linwood Dibbles, MD  dicyclomine (BENTYL) 20 MG tablet Take 1 tablet (20 mg total) by mouth 4 (four) times daily -  before meals and at bedtime. 07/26/18   Cristina Gong, PA-C  metroNIDAZOLE (FLAGYL) 500 MG tablet Take 1 tablet (500 mg total) by mouth 2 (two) times daily. 07/26/18   Cristina Gong, PA-C  ondansetron (ZOFRAN ODT) 4 MG disintegrating tablet Take 1 tablet (4 mg total) by mouth every 8 (eight) hours as needed for nausea or vomiting. 07/26/18   Cristina Gong, PA-C    predniSONE (DELTASONE) 50 MG tablet Take 1 tablet (50 mg total) by mouth daily. 06/26/18   Linwood Dibbles, MD    Family History Family History  Problem Relation Age of Onset  . COPD Father   . Stroke Maternal Grandfather   . Cancer Paternal Grandmother        lung    Social History Social History   Tobacco Use  . Smoking status: Current Every Day Smoker    Years: 9.00    Types: Cigarettes    Last attempt to quit: 02/05/2017    Years since quitting: 1.4  . Smokeless tobacco: Never Used  Substance Use Topics  . Alcohol use: No  . Drug use: No     Allergies   Patient has no known allergies.   Review of Systems Review of Systems  Constitutional: Negative for chills and fever.  Respiratory: Negative for chest tightness and shortness of breath.   Cardiovascular: Negative for chest pain.  Gastrointestinal: Positive for abdominal pain. Negative for diarrhea, nausea, rectal pain and vomiting.  Genitourinary: Positive for vaginal discharge. Negative for difficulty urinating,  dysuria, frequency, hematuria, pelvic pain, vaginal bleeding and vaginal pain.  Musculoskeletal: Negative for back pain.  Skin: Negative for color change.  All other systems reviewed and are negative.    Physical Exam Updated Vital Signs BP 107/68 (BP Location: Left Arm)   Pulse 71   Temp 98.4 F (36.9 C) (Oral)   Resp 16   Ht 5\' 3"  (1.6 m)   Wt 79.3 kg   LMP 07/11/2018   SpO2 100%   BMI 30.97 kg/m   Physical Exam  Constitutional: She appears well-developed and well-nourished. No distress.  HENT:  Head: Normocephalic and atraumatic.  Eyes: Conjunctivae are normal.  Neck: Neck supple.  Cardiovascular: Normal rate and regular rhythm.  No murmur heard. Pulmonary/Chest: Effort normal and breath sounds normal. No respiratory distress.  Abdominal: Soft. There is tenderness in the right lower quadrant. There is positive Murphy's sign. There is no rigidity, no rebound and no guarding.   Genitourinary:  Genitourinary Comments: Pelvic exam performed with RN as chaperone.  Normal external female genitalia.  There is right-sided adnexal tenderness without mass.  No abnormal discharge.  Cervix is closed.  No cervical motion tenderness.  Left adnexa is normal.    Musculoskeletal: She exhibits no edema.  Neurological: She is alert.  Skin: Skin is warm and dry.  Psychiatric: She has a normal mood and affect.  Nursing note and vitals reviewed.    ED Treatments / Results  Labs (all labs ordered are listed, but only abnormal results are displayed) Labs Reviewed  WET PREP, GENITAL - Abnormal; Notable for the following components:      Result Value   Clue Cells Wet Prep HPF POC PRESENT (*)    WBC, Wet Prep HPF POC MANY (*)    All other components within normal limits  URINALYSIS, ROUTINE W REFLEX MICROSCOPIC - Abnormal; Notable for the following components:   Specific Gravity, Urine <1.005 (*)    Hgb urine dipstick TRACE (*)    Leukocytes, UA TRACE (*)    All other components within normal limits  URINALYSIS, MICROSCOPIC (REFLEX) - Abnormal; Notable for the following components:   Bacteria, UA RARE (*)    All other components within normal limits  COMPREHENSIVE METABOLIC PANEL - Abnormal; Notable for the following components:   AST 12 (*)    Total Bilirubin 0.2 (*)    All other components within normal limits  CBC - Abnormal; Notable for the following components:   WBC 10.8 (*)    All other components within normal limits  URINE CULTURE  PREGNANCY, URINE  LIPASE, BLOOD  GC/CHLAMYDIA PROBE AMP ([place]) NOT AT [facility]    EKG None  Radiology Ct Abdomen Pelvis W Contrast  Result Date: 07/26/2018 CLINICAL DATA:  Abdominal pain for 5 days. Predominantly right lower quadrant. EXAM: CT ABDOMEN AND PELVIS WITH CONTRAST TECHNIQUE: Multidetector CT imaging of the abdomen and pelvis was performed using the standard protocol following bolus administration of intravenous  contrast. CONTRAST:  ISOVUE-300 IOPAMIDOL (ISOVUE-300) INJECTION 61% COMPARISON:  CT abdomen pelvis 09/29/2017 FINDINGS: LOWER CHEST: There is no basilar pleural or apical pericardial effusion. HEPATOBILIARY: The hepatic contours and density are normal. There is no intra- or extrahepatic biliary dilatation. The gallbladder is normal. PANCREAS: The pancreatic parenchymal contours are normal and there is no ductal dilatation. There is no peripancreatic fluid collection. SPLEEN: Normal. ADRENALS/URINARY TRACT: --Adrenal glands: Normal. --Right kidney/ureter: No hydronephrosis, nephroureterolithiasis, perinephric stranding or solid renal mass. --Left kidney/ureter: No hydronephrosis, nephroureterolithiasis, perinephric stranding or solid  renal mass. --Urinary bladder: Normal for degree of distention STOMACH/BOWEL: --Stomach/Duodenum: There is no hiatal hernia or other gastric abnormality. The duodenal course and caliber are normal. --Small bowel: No dilatation or inflammation. --Colon: No focal abnormality. --Appendix: Normal. VASCULAR/LYMPHATIC: Normal course and caliber of the major abdominal vessels. No abdominal or pelvic lymphadenopathy. REPRODUCTIVE: Normal uterus and ovaries. MUSCULOSKELETAL. No bony spinal canal stenosis or focal osseous abnormality. OTHER: None. IMPRESSION: No acute abdominal or pelvic abnormality. Normal appendix. Electronically Signed   By: Deatra RobinsonKevin  Herman M.D.   On: 07/26/2018 00:59    Procedures Procedures (including critical care time)  Medications Ordered in ED Medications  morphine 4 MG/ML injection 4 mg (4 mg Intravenous Given 07/25/18 2210)  sodium chloride 0.9 % bolus 1,000 mL ( Intravenous Stopped 07/26/18 0058)  iopamidol (ISOVUE-300) 61 % injection 100 mL (100 mLs Intravenous Contrast Given 07/26/18 0016)  metroNIDAZOLE (FLAGYL) tablet 500 mg (500 mg Oral Given 07/26/18 0134)  dicyclomine (BENTYL) capsule 20 mg (20 mg Oral Given 07/26/18 0133)     Initial  Impression / Assessment and Plan / ED Course  I have reviewed the triage vital signs and the nursing notes.  Pertinent labs & imaging results that were available during my care of the patient were reviewed by me and considered in my medical decision making (see chart for details).    Patient is nontoxic, nonseptic appearing, in no apparent distress.  Patient's pain and other symptoms adequately managed in emergency department.  Fluid bolus given.  Labs, imaging and vitals reviewed.  Patient does not meet the SIRS or Sepsis criteria.  On repeat exam patient does not have a surgical abdomen and there are no peritoneal signs.  No indication of appendicitis, bowel obstruction, bowel perforation, cholecystitis, diverticulitis, PID or ectopic pregnancy.  Wet prep shows clue cells, consistent with BV and the reported increased vaginal discharge.  She is treated with Flagyl.  Patient discharged home with symptomatic treatment, Zofran, Bentyl, and given strict instructions for follow-up with their primary care physician.  I have also discussed reasons to return immediately to the ER.  Patient expresses understanding and agrees with plan.  Final Clinical Impressions(s) / ED Diagnoses   Final diagnoses:  Right lower quadrant abdominal pain  BV (bacterial vaginosis)    ED Discharge Orders         Ordered    dicyclomine (BENTYL) 20 MG tablet  3 times daily before meals & bedtime     07/26/18 0126    metroNIDAZOLE (FLAGYL) 500 MG tablet  2 times daily,   Status:  Discontinued     07/26/18 0126    ondansetron (ZOFRAN ODT) 4 MG disintegrating tablet  Every 8 hours PRN     07/26/18 0126    metroNIDAZOLE (FLAGYL) 500 MG tablet  2 times daily     07/26/18 0126           Cristina GongHammond, Elizabeth W, PA-C 07/26/18 0141    Virgina Norfolkuratolo, Adam, DO 07/26/18 1041

## 2018-07-26 ENCOUNTER — Encounter (HOSPITAL_BASED_OUTPATIENT_CLINIC_OR_DEPARTMENT_OTHER): Payer: Self-pay | Admitting: Diagnostic Radiology

## 2018-07-26 MED ORDER — METRONIDAZOLE 500 MG PO TABS: 500.0000 mg | ORAL_TABLET | Freq: Two times a day (BID) | ORAL | 0 refills | Status: DC

## 2018-07-26 MED ORDER — METRONIDAZOLE 500 MG PO TABS
500.0000 mg | ORAL_TABLET | Freq: Once | ORAL | Status: AC
  Administered 2018-07-26: 500 mg via ORAL
  Filled 2018-07-26: qty 1

## 2018-07-26 MED ORDER — ONDANSETRON 4 MG PO TBDP: 4.0000 mg | ORAL_TABLET | Freq: Three times a day (TID) | ORAL | 0 refills | Status: DC | PRN

## 2018-07-26 MED ORDER — IOPAMIDOL (ISOVUE-300) INJECTION 61%
100.0000 mL | Freq: Once | INTRAVENOUS | Status: AC | PRN
  Administered 2018-07-26: 100 mL via INTRAVENOUS

## 2018-07-26 MED ORDER — DICYCLOMINE HCL 10 MG PO CAPS
20.0000 mg | ORAL_CAPSULE | Freq: Once | ORAL | Status: AC
  Administered 2018-07-26: 20 mg via ORAL
  Filled 2018-07-26: qty 2

## 2018-07-26 MED ORDER — DICYCLOMINE HCL 20 MG PO TABS: 20.0000 mg | ORAL_TABLET | Freq: Three times a day (TID) | ORAL | 0 refills | Status: DC

## 2018-07-26 NOTE — Discharge Instructions (Signed)
Please make sure that you are drinking plenty of water and staying well-hydrated.  Please follow-up with your primary care doctor if your symptoms do not improve in the next 2 days.  If you have additional concerns, or worsening symptoms please seek additional medical care and evaluation.  Today you received medications that may make you sleepy or impair your ability to make decisions.  For the next 24 hours please do not drive, operate heavy machinery, care for a small child with out another adult present, or perform any activities that may cause harm to you or someone else if you were to fall asleep or be impaired.   Today your diagnosed with bacterial vaginosis and received a prescription for metronidazole also known as Flagyl. It is very important that you do not consume any alcohol while taking this medication as it will cause you to become violently ill.  You may have diarrhea from the antibiotics.  It is very important that you continue to take the antibiotics even if you get diarrhea unless a medical professional tells you that you may stop taking them.  If you stop too early the bacteria you are being treated for will become stronger and you may need different, more powerful antibiotics that have more side effects and worsening diarrhea.  Please stay well hydrated and consider probiotics as they may decrease the severity of your diarrhea.  Please be aware that if you take any hormonal contraception (birth control pills, nexplanon, the ring, etc) that your birth control will not work while you are taking antibiotics and you need to use back up protection as directed on the birth control medication information insert.

## 2018-07-27 LAB — GC/CHLAMYDIA PROBE AMP (~~LOC~~) NOT AT ARMC
Chlamydia: NEGATIVE
Neisseria Gonorrhea: NEGATIVE

## 2018-07-27 LAB — URINE CULTURE: CULTURE: NO GROWTH

## 2019-05-14 ENCOUNTER — Emergency Department (HOSPITAL_COMMUNITY)
Admission: EM | Admit: 2019-05-14 | Discharge: 2019-05-14 | Discharge status: Against Medical Advice | Payer: Medicaid Other | Attending: Emergency Medicine | Admitting: Emergency Medicine

## 2019-05-14 ENCOUNTER — Other Ambulatory Visit: Payer: Self-pay

## 2019-05-14 ENCOUNTER — Encounter (HOSPITAL_BASED_OUTPATIENT_CLINIC_OR_DEPARTMENT_OTHER): Payer: Self-pay | Admitting: Emergency Medicine

## 2019-05-14 ENCOUNTER — Inpatient Hospital Stay (EMERGENCY_DEPARTMENT_HOSPITAL)
Admission: AD | Admit: 2019-05-14 | Discharge: 2019-05-14 | Disposition: A | Discharge status: Other Acute Inpatient Hospital | Payer: Medicaid Other | Source: Home / Self Care | Attending: Emergency Medicine | Admitting: Emergency Medicine

## 2019-05-14 ENCOUNTER — Encounter (HOSPITAL_COMMUNITY): Payer: Self-pay | Admitting: Emergency Medicine

## 2019-05-14 DIAGNOSIS — O26891 Other specified pregnancy related conditions, first trimester: Secondary | ICD-10-CM

## 2019-05-14 DIAGNOSIS — O99511 Diseases of the respiratory system complicating pregnancy, first trimester: Secondary | ICD-10-CM | POA: Insufficient documentation

## 2019-05-14 DIAGNOSIS — Z79899 Other long term (current) drug therapy: Secondary | ICD-10-CM | POA: Insufficient documentation

## 2019-05-14 DIAGNOSIS — Z3A08 8 weeks gestation of pregnancy: Secondary | ICD-10-CM | POA: Insufficient documentation

## 2019-05-14 DIAGNOSIS — F17210 Nicotine dependence, cigarettes, uncomplicated: Secondary | ICD-10-CM | POA: Insufficient documentation

## 2019-05-14 DIAGNOSIS — O9A211 Injury, poisoning and certain other consequences of external causes complicating pregnancy, first trimester: Secondary | ICD-10-CM

## 2019-05-14 DIAGNOSIS — O2331 Infections of other parts of urinary tract in pregnancy, first trimester: Secondary | ICD-10-CM | POA: Insufficient documentation

## 2019-05-14 DIAGNOSIS — O23591 Infection of other part of genital tract in pregnancy, first trimester: Secondary | ICD-10-CM | POA: Insufficient documentation

## 2019-05-14 DIAGNOSIS — R109 Unspecified abdominal pain: Secondary | ICD-10-CM | POA: Diagnosis present

## 2019-05-14 DIAGNOSIS — O219 Vomiting of pregnancy, unspecified: Secondary | ICD-10-CM

## 2019-05-14 DIAGNOSIS — Z5321 Procedure and treatment not carried out due to patient leaving prior to being seen by health care provider: Secondary | ICD-10-CM | POA: Insufficient documentation

## 2019-05-14 DIAGNOSIS — R102 Pelvic and perineal pain: Secondary | ICD-10-CM

## 2019-05-14 DIAGNOSIS — J45909 Unspecified asthma, uncomplicated: Secondary | ICD-10-CM | POA: Insufficient documentation

## 2019-05-14 DIAGNOSIS — O99331 Smoking (tobacco) complicating pregnancy, first trimester: Secondary | ICD-10-CM | POA: Insufficient documentation

## 2019-05-14 DIAGNOSIS — O26851 Spotting complicating pregnancy, first trimester: Secondary | ICD-10-CM

## 2019-05-14 LAB — WET PREP, GENITAL
Sperm: NONE SEEN
Trich, Wet Prep: NONE SEEN
Yeast Wet Prep HPF POC: NONE SEEN

## 2019-05-14 LAB — BASIC METABOLIC PANEL
Anion gap: 7 (ref 5–15)
BUN: 5 mg/dL — ABNORMAL LOW (ref 6–20)
CO2: 20 mmol/L — ABNORMAL LOW (ref 22–32)
Calcium: 9.2 mg/dL (ref 8.9–10.3)
Chloride: 108 mmol/L (ref 98–111)
Creatinine, Ser: 0.49 mg/dL (ref 0.44–1.00)
GFR calc Af Amer: >60 mL/min (ref >60)
GFR calc non Af Amer: >60 mL/min (ref >60)
Glucose, Bld: 98 mg/dL (ref 70–99)
Potassium: 3.4 mmol/L — ABNORMAL LOW (ref 3.5–5.1)
Sodium: 135 mmol/L (ref 135–145)

## 2019-05-14 LAB — CBC WITH DIFFERENTIAL/PLATELET
Abs Immature Granulocytes: 0.02 10*3/uL (ref 0.00–0.07)
Basophils Absolute: 0 10*3/uL (ref 0.0–0.1)
Basophils Relative: 0 %
Eosinophils Absolute: 0.4 10*3/uL (ref 0.0–0.5)
Eosinophils Relative: 4 %
HCT: 39.8 % (ref 36.0–46.0)
Hemoglobin: 13.9 g/dL (ref 12.0–15.0)
Immature Granulocytes: 0 %
Lymphocytes Relative: 31 %
Lymphs Abs: 3.3 10*3/uL (ref 0.7–4.0)
MCH: 30.5 pg (ref 26.0–34.0)
MCHC: 34.9 g/dL (ref 30.0–36.0)
MCV: 87.3 fL (ref 80.0–100.0)
Monocytes Absolute: 0.7 10*3/uL (ref 0.1–1.0)
Monocytes Relative: 6 %
Neutro Abs: 6.1 10*3/uL (ref 1.7–7.7)
Neutrophils Relative %: 59 %
Platelets: 276 10*3/uL (ref 150–400)
RBC: 4.56 MIL/uL (ref 3.87–5.11)
RDW: 12.5 % (ref 11.5–15.5)
WBC: 10.5 10*3/uL (ref 4.0–10.5)
nRBC: 0 % (ref 0.0–0.2)

## 2019-05-14 LAB — URINALYSIS, ROUTINE W REFLEX MICROSCOPIC
Bilirubin Urine: NEGATIVE
Glucose, UA: NEGATIVE mg/dL
Hgb urine dipstick: NEGATIVE
Ketones, ur: NEGATIVE mg/dL
Nitrite: NEGATIVE
Protein, ur: NEGATIVE mg/dL
Specific Gravity, Urine: 1.011 (ref 1.005–1.030)
pH: 7 (ref 5.0–8.0)

## 2019-05-14 LAB — I-STAT BETA HCG BLOOD, ED (MC, WL, AP ONLY): I-stat hCG, quantitative: >2000 m[IU]/mL — ABNORMAL HIGH (ref <5)

## 2019-05-14 LAB — HCG, QUANTITATIVE, PREGNANCY: hCG, Beta Chain, Quant, S: 147396 m[IU]/mL — ABNORMAL HIGH (ref <5)

## 2019-05-14 MED ORDER — NITROFURANTOIN MONOHYD MACRO 100 MG PO CAPS
100.0000 mg | ORAL_CAPSULE | Freq: Once | ORAL | Status: AC
  Administered 2019-05-14: 100 mg via ORAL
  Filled 2019-05-14: qty 1

## 2019-05-14 MED ORDER — DOXYLAMINE-PYRIDOXINE 10-10 MG PO TBEC: DELAYED_RELEASE_TABLET | ORAL | 5 refills | Status: DC

## 2019-05-14 MED ORDER — ACETAMINOPHEN 500 MG PO TABS
1000.0000 mg | ORAL_TABLET | Freq: Once | ORAL | Status: AC
  Administered 2019-05-14: 1000 mg via ORAL
  Filled 2019-05-14: qty 2

## 2019-05-14 MED ORDER — METRONIDAZOLE 500 MG PO TABS: 500.0000 mg | ORAL_TABLET | Freq: Two times a day (BID) | ORAL | 0 refills | Status: DC

## 2019-05-14 MED ORDER — METRONIDAZOLE 500 MG PO TABS
500.0000 mg | ORAL_TABLET | Freq: Once | ORAL | Status: AC
  Administered 2019-05-14: 500 mg via ORAL
  Filled 2019-05-14: qty 1

## 2019-05-14 MED ORDER — NITROFURANTOIN MONOHYD MACRO 100 MG PO CAPS: 100.0000 mg | ORAL_CAPSULE | Freq: Two times a day (BID) | ORAL | 0 refills | Status: DC
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
## 2019-05-14 NOTE — ED Notes (Addendum)
Pt requesting meds for yeast infection - states she's very susceptible to yeast infections while taking antibiotics. EDP made aware and no new orders given.
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
## 2019-05-14 NOTE — ED Notes (Signed)
Report given to Lilia Pro RN at [facility], advised RN that pt. can be transferred to MAU.

## 2019-05-14 NOTE — ED Notes (Signed)
Facesheet faxed to [facility] for potential transfer.
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
## 2019-05-14 NOTE — Discharge Instructions (Signed)
[place] [facility] Providers    [facility]       Phone: [phone]  [facility]   Phone: [phone]  [facility]  Phone: [phone]  [facility]  Phone: [phone]  [facility]  Phone: [phone]  [facility]   Phone: [phone]  [facility]       Phone: [phone]  [facility]    Phone: [phone]   [facility]    Phone: [phone]  [facility]    Phone: [phone]  [facility]    Phone: [phone]  [facility]       Phone: [phone]   [facility]  Phone: [phone]  [facility]    Phone: [phone]  [facility]   Phone: [phone]  [facility]      Phone: [phone]  [facility]    Phone: [phone]   
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
## 2019-05-15 LAB — CULTURE, OB URINE: Culture: 80000 — AB

## 2019-05-17 LAB — GC/CHLAMYDIA PROBE AMP (~~LOC~~) NOT AT ARMC
Chlamydia: NEGATIVE
Neisseria Gonorrhea: NEGATIVE

## 2019-06-26 ENCOUNTER — Inpatient Hospital Stay (HOSPITAL_COMMUNITY)
Admission: AD | Admit: 2019-06-26 | Discharge: 2019-06-26 | Discharge status: Against Medical Advice | Payer: Medicaid Other | Attending: Obstetrics & Gynecology | Admitting: Obstetrics & Gynecology

## 2019-06-26 ENCOUNTER — Other Ambulatory Visit: Payer: Self-pay

## 2019-06-26 DIAGNOSIS — R1030 Lower abdominal pain, unspecified: Secondary | ICD-10-CM | POA: Diagnosis not present

## 2019-06-26 DIAGNOSIS — O26899 Other specified pregnancy related conditions, unspecified trimester: Secondary | ICD-10-CM | POA: Diagnosis present

## 2019-06-26 DIAGNOSIS — Z5321 Procedure and treatment not carried out due to patient leaving prior to being seen by health care provider: Secondary | ICD-10-CM | POA: Diagnosis not present

## 2019-06-26 DIAGNOSIS — Z3A00 Weeks of gestation of pregnancy not specified: Secondary | ICD-10-CM | POA: Insufficient documentation

## 2019-06-26 LAB — URINALYSIS, ROUTINE W REFLEX MICROSCOPIC
Bilirubin Urine: NEGATIVE
Glucose, UA: NEGATIVE mg/dL
Hgb urine dipstick: NEGATIVE
Ketones, ur: 5 mg/dL — AB
Leukocytes,Ua: NEGATIVE
Nitrite: NEGATIVE
Protein, ur: NEGATIVE mg/dL
Specific Gravity, Urine: 1.025 (ref 1.005–1.030)
pH: 5 (ref 5.0–8.0)

## 2019-06-26 NOTE — MAU Note (Addendum)
attempted to listen to [place] with doppler, pt too uncomfortable to complete. abd soft on palp.

## 2019-06-26 NOTE — MAU Note (Signed)
Pt in process of moving.  Picked up a heavy box, started having pain in lower abd, now is having pain to lower left of umbilicus. No bleeding.

## 2019-06-26 NOTE — MAU Note (Signed)
Registration had called twice, pt was asking how much longer and if there was someplace else to go to be triaged.   They called again, she was wanting to leave, nurse went out to talk to her, pt left.

## 2019-06-27 LAB — HIV ANTIBODY (ROUTINE TESTING W REFLEX): HIV Screen 4th Generation wRfx: NONREACTIVE

## 2019-06-27 LAB — OB RESULTS CONSOLE RUBELLA ANTIBODY, IGM: Rubella: IMMUNE

## 2019-06-27 LAB — OB RESULTS CONSOLE VARICELLA ZOSTER ANTIBODY, IGG: Varicella: IMMUNE

## 2019-06-27 LAB — OB RESULTS CONSOLE HGB/HCT, BLOOD
HCT: 37 (ref 29–41)
Hemoglobin: 12.6

## 2019-06-27 LAB — OB RESULTS CONSOLE PLATELET COUNT: Platelets: 284

## 2019-06-30 LAB — OB RESULTS CONSOLE HGB/HCT, BLOOD
HCT: 34 (ref 29–41)
Hemoglobin: 11.8

## 2019-06-30 LAB — OB RESULTS CONSOLE PLATELET COUNT: Platelets: 254

## 2019-07-14 DIAGNOSIS — Z3480 Encounter for supervision of other normal pregnancy, unspecified trimester: Secondary | ICD-10-CM | POA: Insufficient documentation

## 2019-07-17 ENCOUNTER — Ambulatory Visit: Payer: Medicaid Other | Admitting: Advanced Practice Midwife

## 2019-07-18 NOTE — Progress Notes (Signed)
Pt cancelled/no show for new OB appt 07/17/19. She is receiving care at [facility]: Lambert Rhonda W.

## 2019-09-08 ENCOUNTER — Inpatient Hospital Stay (HOSPITAL_COMMUNITY)
Admission: AD | Admit: 2019-09-08 | Discharge: 2019-09-08 | Disposition: A | Discharge status: Home / Self Care | Payer: Medicaid Other | Attending: Obstetrics and Gynecology | Admitting: Obstetrics and Gynecology

## 2019-09-08 ENCOUNTER — Encounter (HOSPITAL_COMMUNITY): Payer: Self-pay | Admitting: Obstetrics and Gynecology

## 2019-09-08 ENCOUNTER — Other Ambulatory Visit: Payer: Self-pay

## 2019-09-08 DIAGNOSIS — Z6790 Unspecified blood type, Rh positive: Secondary | ICD-10-CM | POA: Diagnosis not present

## 2019-09-08 DIAGNOSIS — O26892 Other specified pregnancy related conditions, second trimester: Secondary | ICD-10-CM | POA: Diagnosis not present

## 2019-09-08 DIAGNOSIS — R1032 Left lower quadrant pain: Secondary | ICD-10-CM | POA: Insufficient documentation

## 2019-09-08 DIAGNOSIS — M545 Low back pain: Secondary | ICD-10-CM | POA: Diagnosis not present

## 2019-09-08 DIAGNOSIS — O99891 Other specified diseases and conditions complicating pregnancy: Secondary | ICD-10-CM | POA: Diagnosis not present

## 2019-09-08 DIAGNOSIS — Z3A24 24 weeks gestation of pregnancy: Secondary | ICD-10-CM | POA: Insufficient documentation

## 2019-09-08 DIAGNOSIS — O360920 Maternal care for other rhesus isoimmunization, second trimester, not applicable or unspecified: Secondary | ICD-10-CM

## 2019-09-08 DIAGNOSIS — Z825 Family history of asthma and other chronic lower respiratory diseases: Secondary | ICD-10-CM | POA: Insufficient documentation

## 2019-09-08 DIAGNOSIS — W501XXA Accidental kick by another person, initial encounter: Secondary | ICD-10-CM | POA: Insufficient documentation

## 2019-09-08 DIAGNOSIS — O99332 Smoking (tobacco) complicating pregnancy, second trimester: Secondary | ICD-10-CM | POA: Insufficient documentation

## 2019-09-08 DIAGNOSIS — O9A219 Injury, poisoning and certain other consequences of external causes complicating pregnancy, unspecified trimester: Secondary | ICD-10-CM

## 2019-09-08 DIAGNOSIS — O9A212 Injury, poisoning and certain other consequences of external causes complicating pregnancy, second trimester: Secondary | ICD-10-CM | POA: Diagnosis not present

## 2019-09-08 DIAGNOSIS — F17210 Nicotine dependence, cigarettes, uncomplicated: Secondary | ICD-10-CM | POA: Insufficient documentation

## 2019-09-08 MED ORDER — ACETAMINOPHEN 500 MG PO TABS: 1000.0000 mg | ORAL_TABLET | Freq: Four times a day (QID) | ORAL | Status: DC | PRN

## 2019-09-08 NOTE — Discharge Instructions (Signed)
Preventing Injuries During Pregnancy  Injuries can happen during pregnancy. Minor falls and accidents usually do not harm you or your baby. But some injuries can harm you and your baby. Tell your doctor about any injury you suffer. What can I do to avoid injuries? Safety  Remove rugs and loose objects on the floor.  Wear comfortable shoes that have a good grip. Do not wear shoes that have high heels.  Always wear your seat belt in the car. The lap belt should be below your belly. Always drive safely.  Do not ride on a motorcycle. Activity  Do not take part in rough and violent activities or sports.  Avoid: ? Walking on wet or slippery floors. ? Lifting heavy pots of boiling or hot liquids. ? Fixing electrical problems. ? Being near fires. General instructions  Take over-the-counter and prescription medicines only as told by your doctor.  Know your blood type and the blood type of the baby's father.  If you are a victim of domestic violence: ? Call your local emergency services (911 in the [place] ? Contact the [employer] Violence Hotline for help and support. Get help right away if:  You fall on your belly or receive any serious blow to your belly.  You have a stiff neck or neck pain after a fall or an injury.  You get a headache or have problems with vision after an injury.  You do not feel the baby move or the baby is not moving as much as normal.  You have been a victim of domestic violence or any other kind of attack.  You have been in a car accident.  You have bleeding from your vagina.  Fluid is leaking from your vagina.  You start to have cramping or pain in your belly (contractions).  You have very bad pain in your lower back.  You feel weak or pass out (faint).  You start to throw up (vomit) after an injury.  You have been burned. Summary  Some injuries that happen during pregnancy can do harm to the baby.  Tell your doctor about any  injury.  Take steps to avoid injury. This includes removing rugs and loose objects on the floor. Always wear your seat belt in the car.  Do not take part in rough and violent activities or sports.  Get help right away if you have any serious accident or injury. This information is not intended to replace advice given to you by your health care provider. Make sure you discuss any questions you have with your health care provider. Document Revised: 05/19/2019 Document Reviewed: 09/02/2016 Elsevier Patient Education  2020 Elsevier Inc.   Abdominal Pain During Pregnancy  Belly (abdominal) pain is common during pregnancy. There are many possible causes. Most of the time, it is not a serious problem. Other times, it can be a sign that something is wrong with the pregnancy. Always tell your doctor if you have belly pain. Follow these instructions at home:  Do not have sex or put anything in your vagina until your pain goes away completely.  Get plenty of rest until your pain gets better.  Drink enough fluid to keep your pee (urine) pale yellow.  Take over-the-counter and prescription medicines only as told by your doctor.  Keep all follow-up visits as told by your doctor. This is important. Contact a doctor if:  Your pain continues or gets worse after resting.  You have lower belly pain that: ? Comes and goes  at regular times. ? Spreads to your back. ? Feels like menstrual cramps.  You have pain or burning when you pee (urinate). Get help right away if:  You have a fever or chills.  You have vaginal bleeding.  You are leaking fluid from your vagina.  You are passing tissue from your vagina.  You throw up (vomit) for more than 24 hours.  You have watery poop (diarrhea) for more than 24 hours.  Your baby is moving less than usual.  You feel very weak or faint.  You have shortness of breath.  You have very bad pain in your upper belly. Summary  Belly (abdominal) pain  is common during pregnancy. There are many possible causes.  If you have belly pain during pregnancy, tell your doctor right away.  Keep all follow-up visits as told by your doctor. This is important. This information is not intended to replace advice given to you by your health care provider. Make sure you discuss any questions you have with your health care provider. Document Revised: 12/12/2018 Document Reviewed: 11/26/2016 Elsevier Patient Education  [employer].
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
## 2019-11-25 IMAGING — CT CT ABD-PELV W/ CM
2 of 4 series · 16 of 46 positions shown, 18 images · IV contrast (APPLIED)
Comparison: CT abdomen pelvis 09/29/2017

CLINICAL DATA: Abdominal pain for 5 days. Predominantly right lower
quadrant.

EXAM:
CT ABDOMEN AND PELVIS WITH CONTRAST
TECHNIQUE: Multidetector CT imaging of the abdomen and pelvis was performed
using the standard protocol following bolus administration of
intravenous contrast.
CONTRAST:  100mL YPNLGB-1EE IOPAMIDOL (YPNLGB-1EE) INJECTION 61%

[Series 2: axial st · axial · 0.89mm/px · z∈[-486,-36]mm · 13 of 100 slices shown, 15 images]
[im 5/100  soft-tissue]
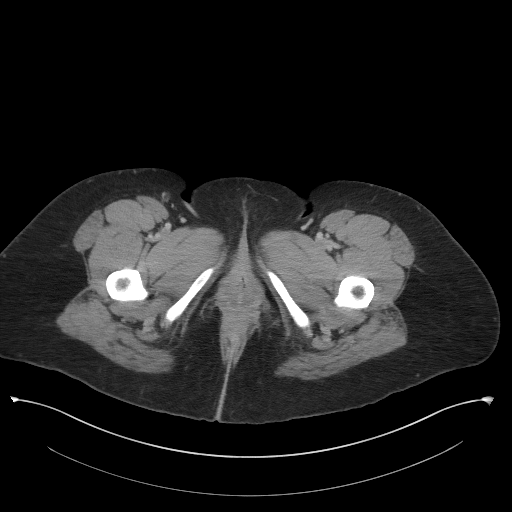
[im 5/100  bone]
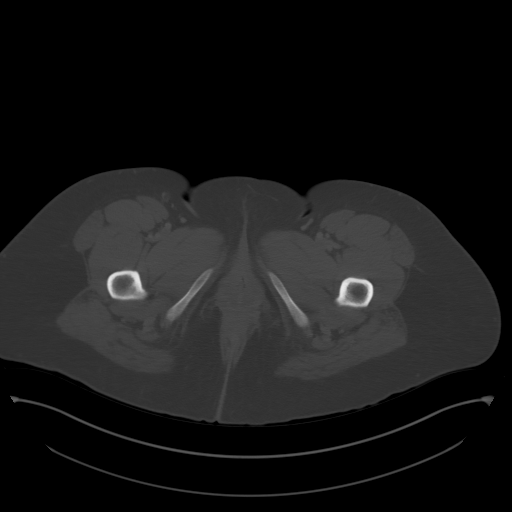
[im 13/100  soft-tissue]
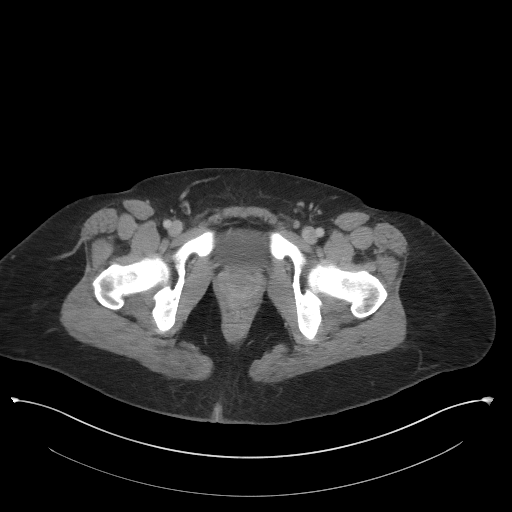
[im 21/100  soft-tissue]
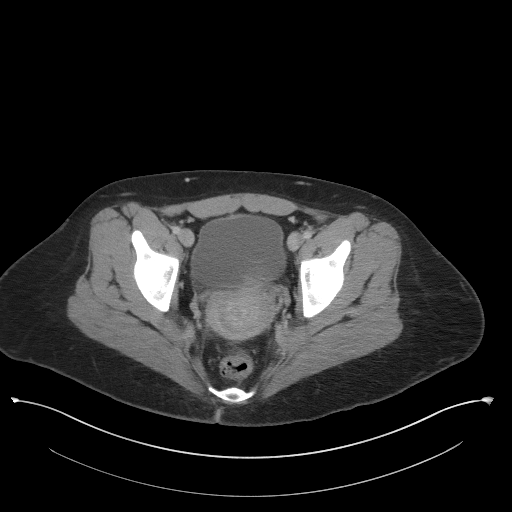
[im 29/100  soft-tissue]
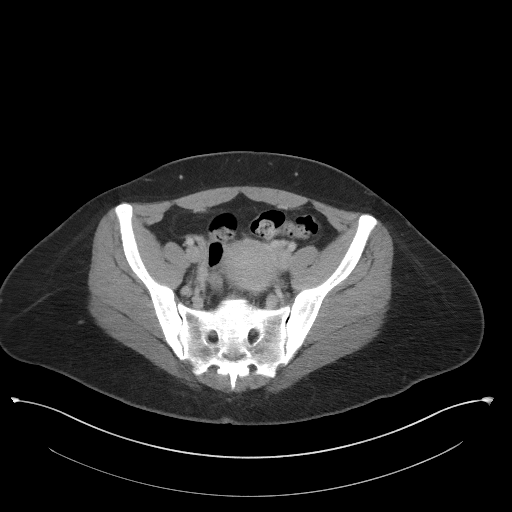
[im 34/100  soft-tissue]
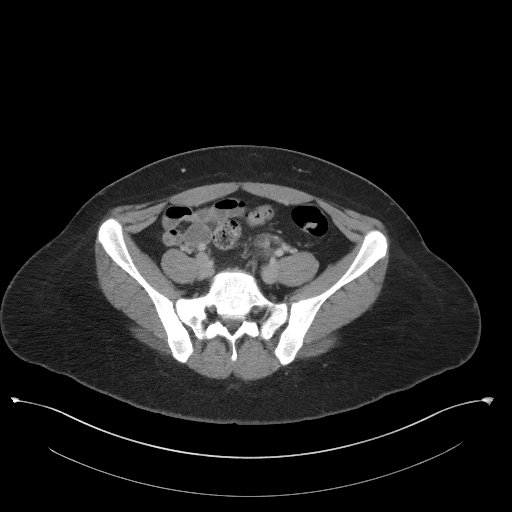
[im 42/100  soft-tissue]
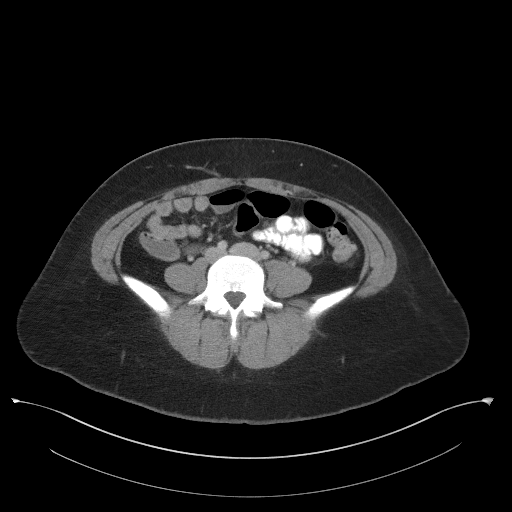
[im 50/100  soft-tissue]
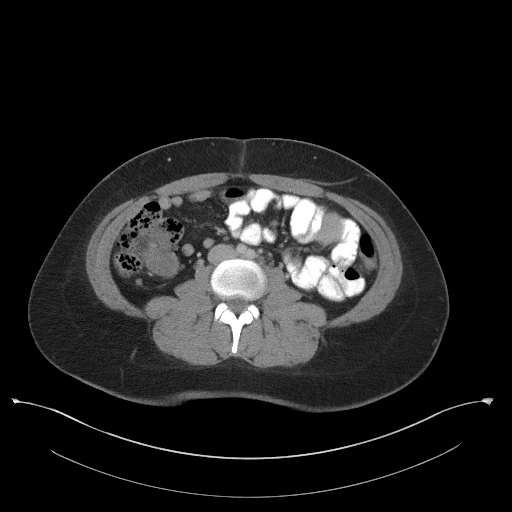
[im 58/100  soft-tissue]
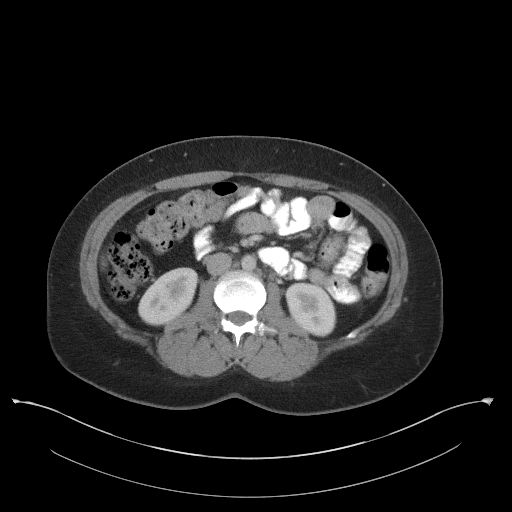
[im 67/100  soft-tissue]
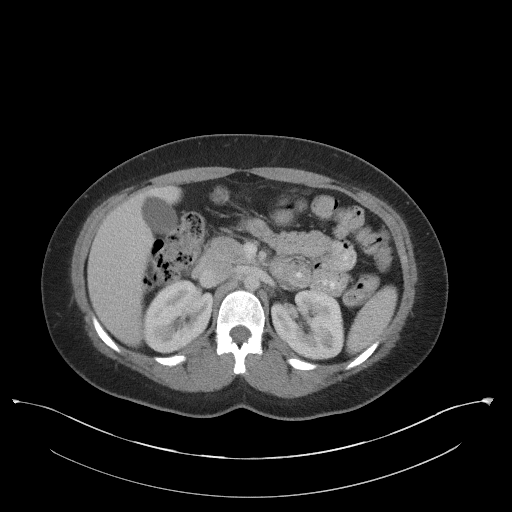
[im 67/100  bone]
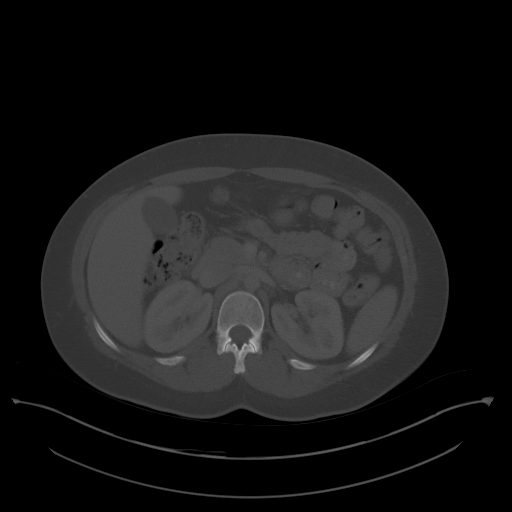
[im 71/100  soft-tissue]
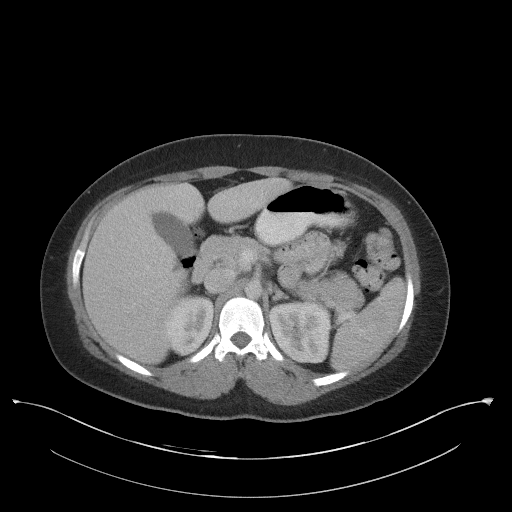
[im 79/100  soft-tissue]
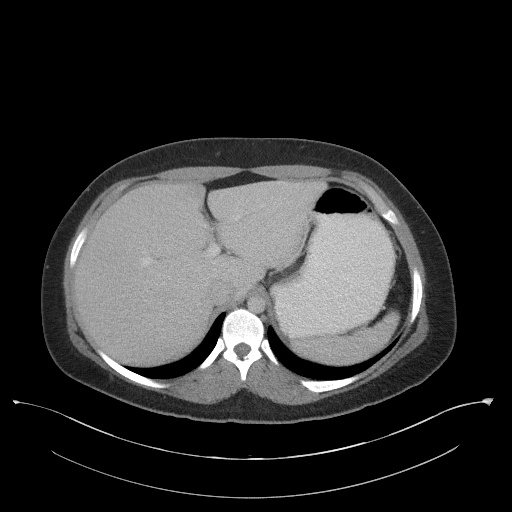
[im 87/100  soft-tissue]
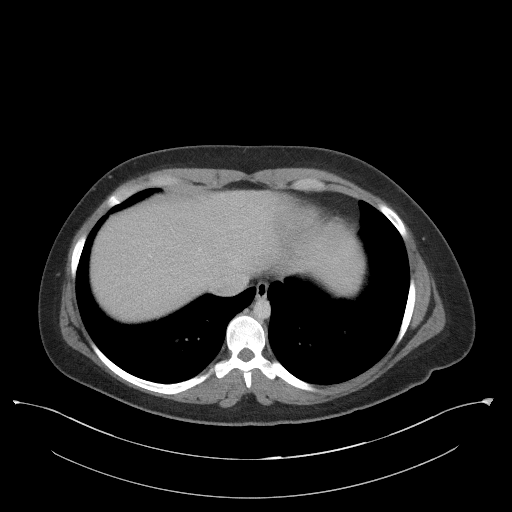
[im 95/100  soft-tissue]
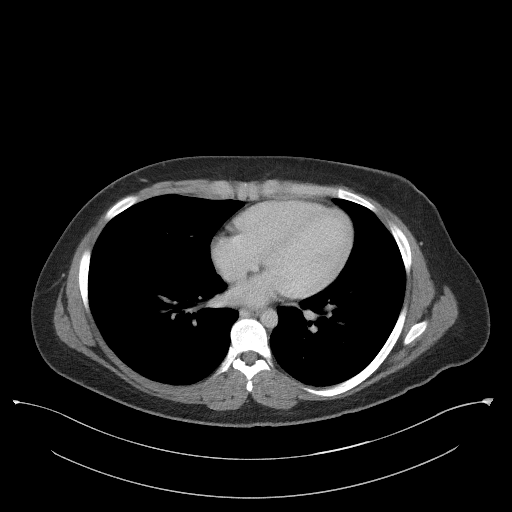

[Series 5: coronal st · coronal · 0.75mm/px · 3 of 83 slices shown]
[im 28/83  soft-tissue]
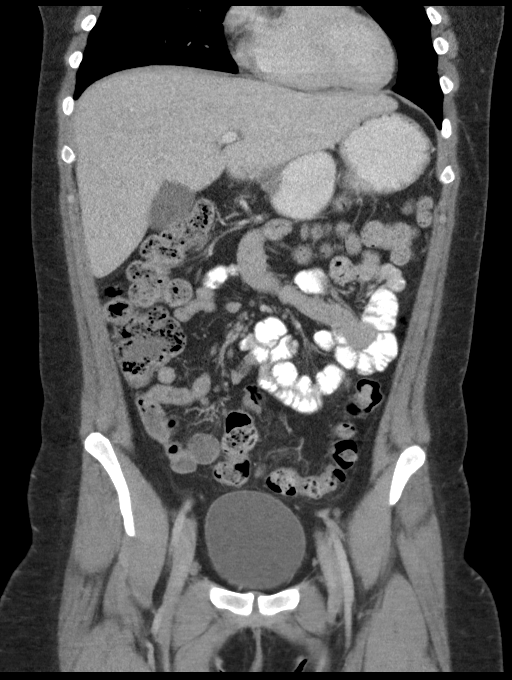
[im 37/83  soft-tissue]
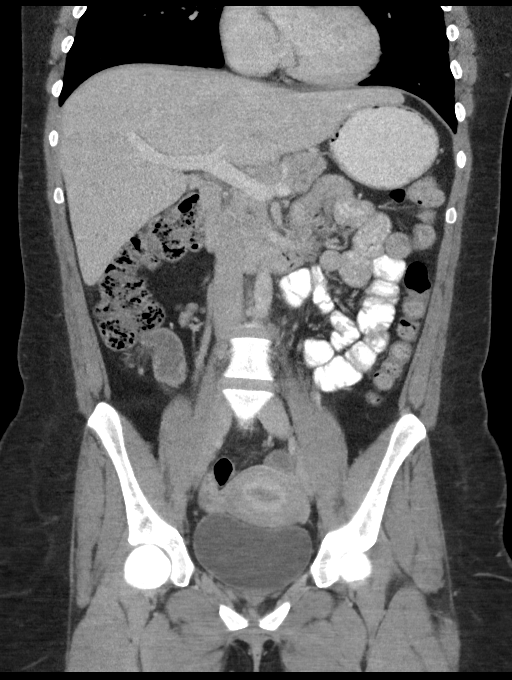
[im 46/83  soft-tissue]
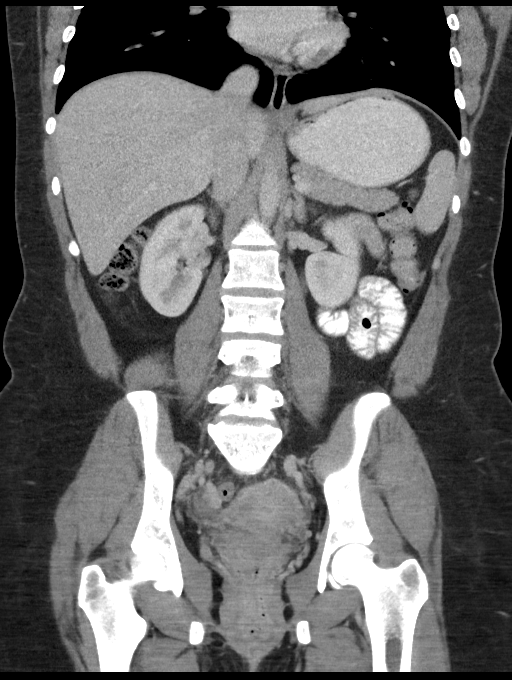

[16 of 46 positions shown; findings below may reference images not displayed]

FINDINGS: LOWER CHEST: There is no basilar pleural or apical pericardial
effusion.

HEPATOBILIARY: The hepatic contours and density are normal. There is
no intra- or extrahepatic biliary dilatation. The gallbladder is
normal.

PANCREAS: The pancreatic parenchymal contours are normal and there
is no ductal dilatation. There is no peripancreatic fluid
collection.

SPLEEN: Normal.

ADRENALS/URINARY TRACT:

--Adrenal glands: Normal.

--Right kidney/ureter: No hydronephrosis, nephroureterolithiasis,
perinephric stranding or solid renal mass.

--Left kidney/ureter: No hydronephrosis, nephroureterolithiasis,
perinephric stranding or solid renal mass.

--Urinary bladder: Normal for degree of distention

STOMACH/BOWEL:

--Stomach/Duodenum: There is no hiatal hernia or other gastric
abnormality. The duodenal course and caliber are normal.

--Small bowel: No dilatation or inflammation.

--Colon: No focal abnormality.

--Appendix: Normal.

VASCULAR/LYMPHATIC: Normal course and caliber of the major abdominal
vessels. No abdominal or pelvic lymphadenopathy.

REPRODUCTIVE: Normal uterus and ovaries.

MUSCULOSKELETAL. No bony spinal canal stenosis or focal osseous
abnormality.

OTHER: None.
IMPRESSION: No acute abdominal or pelvic abnormality.

Normal appendix.

## 2021-05-03 ENCOUNTER — Other Ambulatory Visit: Payer: Self-pay

## 2021-05-03 ENCOUNTER — Emergency Department (HOSPITAL_BASED_OUTPATIENT_CLINIC_OR_DEPARTMENT_OTHER)
Admission: EM | Admit: 2021-05-03 | Discharge: 2021-05-03 | Disposition: A | Discharge status: Home / Self Care | Payer: BC Managed Care – PPO | Attending: Emergency Medicine | Admitting: Emergency Medicine

## 2021-05-03 ENCOUNTER — Encounter (HOSPITAL_BASED_OUTPATIENT_CLINIC_OR_DEPARTMENT_OTHER): Payer: Self-pay | Admitting: *Deleted

## 2021-05-03 DIAGNOSIS — M791 Myalgia, unspecified site: Secondary | ICD-10-CM | POA: Insufficient documentation

## 2021-05-03 DIAGNOSIS — R509 Fever, unspecified: Secondary | ICD-10-CM | POA: Diagnosis not present

## 2021-05-03 DIAGNOSIS — R519 Headache, unspecified: Secondary | ICD-10-CM | POA: Diagnosis not present

## 2021-05-03 DIAGNOSIS — Z5321 Procedure and treatment not carried out due to patient leaving prior to being seen by health care provider: Secondary | ICD-10-CM | POA: Insufficient documentation

## 2021-05-03 NOTE — ED Notes (Signed)
Per screener in waiting room, pt left

## 2021-05-03 NOTE — ED Triage Notes (Signed)
Pt reports fever, body aches, and headache since yesterday. States she had a  fever of 104.5 and took ibuprofen and tylenol pta. Alert. NAD

## 2021-06-04 ENCOUNTER — Encounter (HOSPITAL_BASED_OUTPATIENT_CLINIC_OR_DEPARTMENT_OTHER): Payer: Self-pay

## 2021-06-04 ENCOUNTER — Other Ambulatory Visit: Payer: Self-pay

## 2021-06-04 ENCOUNTER — Emergency Department (HOSPITAL_BASED_OUTPATIENT_CLINIC_OR_DEPARTMENT_OTHER)
Admission: EM | Admit: 2021-06-04 | Discharge: 2021-06-04 | Disposition: A | Discharge status: Home / Self Care | Payer: BC Managed Care – PPO | Attending: Emergency Medicine | Admitting: Emergency Medicine

## 2021-06-04 ENCOUNTER — Emergency Department (HOSPITAL_BASED_OUTPATIENT_CLINIC_OR_DEPARTMENT_OTHER): Payer: BC Managed Care – PPO

## 2021-06-04 DIAGNOSIS — J45909 Unspecified asthma, uncomplicated: Secondary | ICD-10-CM | POA: Diagnosis not present

## 2021-06-04 DIAGNOSIS — F17210 Nicotine dependence, cigarettes, uncomplicated: Secondary | ICD-10-CM | POA: Diagnosis not present

## 2021-06-04 DIAGNOSIS — N939 Abnormal uterine and vaginal bleeding, unspecified: Secondary | ICD-10-CM | POA: Insufficient documentation

## 2021-06-04 DIAGNOSIS — R102 Pelvic and perineal pain: Secondary | ICD-10-CM | POA: Diagnosis present

## 2021-06-04 LAB — URINALYSIS, ROUTINE W REFLEX MICROSCOPIC
Bilirubin Urine: NEGATIVE
Glucose, UA: NEGATIVE mg/dL
Ketones, ur: NEGATIVE mg/dL
Leukocytes,Ua: NEGATIVE
Nitrite: NEGATIVE
Protein, ur: NEGATIVE mg/dL
Specific Gravity, Urine: 1.02 (ref 1.005–1.030)
pH: 6.5 (ref 5.0–8.0)

## 2021-06-04 LAB — WET PREP, GENITAL
Clue Cells Wet Prep HPF POC: NONE SEEN
Sperm: NONE SEEN
Trich, Wet Prep: NONE SEEN
Yeast Wet Prep HPF POC: NONE SEEN

## 2021-06-04 LAB — URINALYSIS, MICROSCOPIC (REFLEX)

## 2021-06-04 LAB — PREGNANCY, URINE: Preg Test, Ur: NEGATIVE

## 2021-06-04 MED ORDER — OXYCODONE-ACETAMINOPHEN 5-325 MG PO TABS
1.0000 | ORAL_TABLET | Freq: Once | ORAL | Status: AC
Start: 2021-06-04 — End: 2021-06-04
  Administered 2021-06-04: 1 via ORAL
  Filled 2021-06-04: qty 1

## 2021-06-04 NOTE — Discharge Instructions (Addendum)
Lab work and exam are all reassuring.  Please continue with all home medication as prescribed.  I recommend ibuprofen Tylenol as needed for pelvic pain.  Please follow-up with your OB/GYN for further evaluation.  Come back to the emergency department if you have severe or worsening vaginal bleeding, severe worsening pelvic pain or vaginal pain, become lightheaded, dizziness, chest pain, shortness of breath, or fatigued as you will need further eval.

## 2021-06-04 NOTE — ED Triage Notes (Signed)
Pt c/o abd cramps and abnormal vaginal bleeding x 3 days-LNMP 9/4-NAD-steady gait

## 2021-06-04 NOTE — ED Notes (Signed)
US at bedside

## 2021-06-04 NOTE — ED Notes (Signed)
Pelvic cart set up in room 

## 2021-06-04 NOTE — ED Provider Notes (Signed)
[facility] EMERGENCY DEPARTMENT Provider Note   CSN: [id number] Arrival date & time: 06/04/21  1316     History Chief Complaint  Patient presents with   Vaginal Bleeding    Brittany Maldonado is a 30 y.o. female.  HPI  Patient with no significant medical history presents to the emergency department with chief complaint of vaginal bleeding x3 days.  Patient states the bleeding is constant, she states that she is gone through 3 small pads daily, she has associated pelvic pain, states the pain is intermittent, does not radiate.  Patient's last known menstrual cycle was September 4, she states she is generally regular, she is currently not on birth control at this time, she is not actively trying to get pregnant.  She has no history of ovarian torsion's or ovarian cysts, she denies  uterine surgeries in the past.  She denies urinary symptoms, pelvic discharge, stomach pain, nausea, vomiting, or flank pain.  States she  never had this in the past.  She has no other complaints at this time.  Past Medical History:  Diagnosis Date   Anxiety    Asthma    exercise induced   Depression    good  right now    Patient Active Problem List   Diagnosis Date Noted   Supervision of other normal pregnancy, antepartum 07/14/2019    Past Surgical History:  Procedure Laterality Date   RHINOPLASTY     reset broken nose     OB History     Gravida  4   Para  1   Term  1   Preterm  0   AB  2   Living  1      SAB  1   IAB  1   Ectopic      Multiple      Live Births  1        Obstetric Comments  Induced due to polyhydramnios. H/o fetal hydronephrosis. 38 hours of labor          Family History  Problem Relation Age of Onset   COPD Father    Stroke Maternal Grandfather    Cancer Paternal Grandmother        lung    Social History   Tobacco Use   Smoking status: Every Day    Packs/day: 0.50    Years: 9.00    Pack years: 4.50    Types: Cigarettes    Smokeless tobacco: Never  Vaping Use   Vaping Use: Never used  Substance Use Topics   Alcohol use: No   Drug use: No    Home Medications Prior to Admission medications   Medication Sig Start Date End Date Taking? Authorizing Provider  Doxylamine-Pyridoxine (DICLEGIS) 10-10 MG TBEC Take 2 tabs at bedtime. If needed, add another tab in the morning. If needed, add another tab in the afternoon, up to 4 tabs/day. 05/14/19   Leftwich-Kirby, Wilmer Floor, CNM  ondansetron (ZOFRAN ODT) 4 MG disintegrating tablet Take 1 tablet (4 mg total) by mouth every 8 (eight) hours as needed for nausea or vomiting. 07/26/18   Cristina Gong, PA-C  Prenatal Vit-Fe Fumarate-FA (PRENATAL MULTIVITAMIN) TABS tablet Take 1 tablet by mouth daily at 12 noon.    [provider]    Allergies    Patient has no known allergies.  Review of Systems   Review of Systems  Constitutional:  Negative for chills and fever.  HENT:  Negative for congestion.   Respiratory:  Negative for shortness of breath.   Cardiovascular:  Negative for chest pain.  Gastrointestinal:  Negative for abdominal pain, diarrhea, nausea and vomiting.  Genitourinary:  Positive for pelvic pain and vaginal bleeding. Negative for decreased urine volume, enuresis, hematuria, vaginal discharge and vaginal pain.  Musculoskeletal:  Negative for back pain.  Skin:  Negative for rash.  Neurological:  Negative for dizziness.  Hematological:  Does not bruise/bleed easily.   Physical Exam Updated Vital Signs BP 107/70   Pulse 67   Temp 98.3 F (36.8 C) (Oral)   Resp 16   Ht 5\' 3"  (1.6 m)   Wt 85.3 kg   LMP 05/11/2021   SpO2 99%   BMI 33.30 kg/m   Physical Exam Vitals and nursing note reviewed. Exam conducted with a chaperone present.  Constitutional:      General: She is not in acute distress.    Appearance: She is not ill-appearing.  HENT:     Head: Normocephalic and atraumatic.     Nose: No congestion.  Eyes:     Conjunctiva/sclera:  Conjunctivae normal.  Cardiovascular:     Rate and Rhythm: Normal rate and regular rhythm.     Pulses: Normal pulses.     Heart sounds: No murmur heard.   No friction rub. No gallop.  Pulmonary:     Effort: No respiratory distress.     Breath sounds: No wheezing, rhonchi or rales.  Abdominal:     Palpations: Abdomen is soft.     Tenderness: There is no abdominal tenderness. There is no right CVA tenderness or left CVA tenderness.  Genitourinary:    Comments: With chaperone present pelvic exam was performed  had noted dried blood on the labia, no other gross abnormalities present, vaginal vault was patent and pink, she had noted blood in the posterior aspect coming from the cervix, no other gross abnormalities present.  Cervix had no lesions, no abrasions.  Bimanual exam was performed has noted right-sided adnexal pain negative chandelier sign. Skin:    General: Skin is warm and dry.  Neurological:     Mental Status: She is alert.  Psychiatric:        Mood and Affect: Mood normal.    ED Results / Procedures / Treatments   Labs (all labs ordered are listed, but only abnormal results are displayed) Labs Reviewed  WET PREP, GENITAL - Abnormal; Notable for the following components:      Result Value   WBC, Wet Prep HPF POC FEW (*)    All other components within normal limits  URINALYSIS, ROUTINE W REFLEX MICROSCOPIC - Abnormal; Notable for the following components:   Hgb urine dipstick TRACE (*)    All other components within normal limits  URINALYSIS, MICROSCOPIC (REFLEX) - Abnormal; Notable for the following components:   Bacteria, UA RARE (*)    All other components within normal limits  PREGNANCY, URINE  GC/CHLAMYDIA PROBE AMP ([place]) NOT AT [facility]    EKG None  Radiology [facility] PELVIS (TRANSABDOMINAL ONLY)  Result Date: 06/04/2021 CLINICAL DATA:  Vaginal bleeding. Right-sided pelvic pain. Last menstrual period 05/12/2021. EXAM: TRANSABDOMINAL ULTRASOUND OF PELVIS DOPPLER  ULTRASOUND OF OVARIES TECHNIQUE: Transabdominal ultrasound examination of the pelvis was performed including evaluation of the uterus, ovaries, adnexal regions, and pelvic cul-de-sac. Color and duplex Doppler ultrasound was utilized to evaluate blood flow to the ovaries. COMPARISON:  CT abdomen pelvis 07/26/2018 FINDINGS: Uterus Measurements: 8.4 x 2.8 x 6.4 cm = volume: 78 mL. No fibroids or other  mass visualized. Endometrium Thickness: 5 mm.  No focal abnormality visualized. Right ovary Measurements: 2.5 x 1.6 x 1.4 cm = volume: 3 mL. Normal appearance/no adnexal mass. Left ovary Measurements: 3.9 x 1.5 x 3.1 cm = volume: 9.2 mL. Normal appearance/no adnexal mass. Pulsed Doppler evaluation demonstrates normal low-resistance arterial and venous waveforms in both ovaries. Other: No free pelvic fluid. IMPRESSION: Unremarkable pelvic ultrasound. Electronically Signed   By: Tish Frederickson M.D.   On: 06/04/2021 16:42    Procedures Procedures   Medications Ordered in ED Medications  oxyCODONE-acetaminophen (PERCOCET/ROXICET) 5-325 MG per tablet 1 tablet (1 tablet Oral Given 06/04/21 1523)    ED Course  I have reviewed the triage vital signs and the nursing notes.  Pertinent labs & imaging results that were available during my care of the patient were reviewed by me and considered in my medical decision making (see chart for details).    MDM Rules/Calculators/A&P                          Initial impression-patient presents with vaginal bleeding and pelvic pain.  She is alert, does not appear in distress, vital signs reassuring.  Triage obtain UA and urine pregnancy will add on GC chlamydia, wet prep recommend pelvic exam for further evaluation.  Work-up-UA is unremarkable, urine pregnancy is negative, wet prep shows few white blood cells.  Transvaginal sound negative for acute findings.  Reassessment-there is noted blood during my pelvic exam, she had right adnexal pain, I am concerned for possible  ovarian torsion will recommend transvaginal ultrasound for further evaluation.  Patient agreement this plan.  Patient was updated on findings, she has no complaints this time,   It is noted that patient BP was slightly low, states that it normally runs low especially when she is laying down.  I personally checked it myself and it was 107/70, she is not endorsing lightheaded dizziness chest pain or shortness of breath, I doubt patient is suffering from symptomatic anemia from acute blood loss..  Consult**  Rule out-low suspicion for ovarian torsion as imaging is negative for this, presentation is all also atypical as she is not in severe amount of pain.  I have low suspicion for ectopic pregnancy as urine pregnancy is negative.  Low suspicion for STI as there is no noted vaginal discharge present my exam, she does not endorse vaginal pain or itchiness.  Low suspicion for UTI, Pilo, kidney stone as UA is negative for signs of infection and or hematuria, she also has no CVA tenderness on my exam.  Low suspicion for appendicitis as she has no tenderness in her right lower quadrant, vital signs reassuring, no associated nausea or vomiting, patient nontoxic-appearing.  Low suspicion for complicated diverticulitis as she has no left lower quadrant tenderness, vital signs reassuring presentation atypical of etiology..   Plan-  Vaginal bleeding-unclear etiology differential includes early menstrual cycle, endometriosis, fibroids, we will have her follow-up with OB/GYN for further evaluation, gave her strict return precautions.  Vital signs have remained stable, no indication for hospital admission.   Patient given at home care as well strict return precautions.  Patient verbalized that they understood agreed to said plan.  Final Clinical Impression(s) / ED Diagnoses Final diagnoses:  Abnormal vaginal bleeding    Rx / DC Orders ED Discharge Orders     None        Barnie Del 06/04/21  1719    Gloris Manchester, MD  06/05/21 1942  

## 2021-06-04 NOTE — ED Notes (Signed)
Chaperone for EDP Chrissie Noa, [place] for pelvic  exam. Pt tolerated well.

## 2021-06-05 LAB — GC/CHLAMYDIA PROBE AMP (~~LOC~~) NOT AT ARMC
Chlamydia: NEGATIVE
Comment: NEGATIVE
Comment: NORMAL
Neisseria Gonorrhea: NEGATIVE

## 2021-12-02 ENCOUNTER — Encounter (HOSPITAL_BASED_OUTPATIENT_CLINIC_OR_DEPARTMENT_OTHER): Payer: Self-pay | Admitting: Emergency Medicine

## 2021-12-02 ENCOUNTER — Emergency Department (HOSPITAL_BASED_OUTPATIENT_CLINIC_OR_DEPARTMENT_OTHER)
Admission: EM | Admit: 2021-12-02 | Discharge: 2021-12-02 | Disposition: A | Discharge status: Home / Self Care | Payer: Medicaid Other | Attending: Emergency Medicine | Admitting: Emergency Medicine

## 2021-12-02 DIAGNOSIS — L02214 Cutaneous abscess of groin: Secondary | ICD-10-CM | POA: Diagnosis not present

## 2021-12-02 DIAGNOSIS — L732 Hidradenitis suppurativa: Secondary | ICD-10-CM

## 2021-12-02 HISTORY — DX: Hidradenitis suppurativa: L73.2

## 2021-12-02 MED ORDER — LIDOCAINE-EPINEPHRINE (PF) 1 %-1:200000 IJ SOLN
20.0000 mL | Freq: Once | INTRAMUSCULAR | Status: AC
  Administered 2021-12-02: 20 mL
  Filled 2021-12-02: qty 30

## 2021-12-02 NOTE — ED Provider Notes (Signed)
?[facility] EMERGENCY DEPARTMENT ?Provider Note ? ? ?CSN: [id number] ?Arrival date & time: 12/02/21  0247 ? ?  ? ?History ? ?Chief Complaint  ?Patient presents with  ? Abscess  ? ? ?Brittany Maldonado is a 31 y.o. female. ? ?The history is provided by the patient.  ?Abscess ?She has history of hidradenitis suppurativa and comes in because of a flareup in her right inguinal area.  She has noted some pain and swelling for about the last 4 days, getting worse.  She has tried using warm compresses without any benefit.  She denies any fever or chills. ?  ?Home Medications ?Prior to Admission medications   ?Medication Sig Start Date End Date Taking? Authorizing Provider  ?Doxylamine-Pyridoxine (DICLEGIS) 10-10 MG TBEC Take 2 tabs at bedtime. If needed, add another tab in the morning. If needed, add another tab in the afternoon, up to 4 tabs/day. 05/14/19   Leftwich-Kirby, Wilmer Floor, CNM  ?ondansetron (ZOFRAN ODT) 4 MG disintegrating tablet Take 1 tablet (4 mg total) by mouth every 8 (eight) hours as needed for nausea or vomiting. 07/26/18   Cristina Gong, PA-C  ?Prenatal Vit-Fe Fumarate-FA (PRENATAL MULTIVITAMIN) TABS tablet Take 1 tablet by mouth daily at 12 noon.    [provider]  ?   ? ?Allergies    ?Patient has no known allergies.   ? ?Review of Systems   ?Review of Systems  ?All other systems reviewed and are negative. ? ?Physical Exam ?Updated Vital Signs ?BP 101/64 (BP Location: Left Arm)   Pulse 80   Temp 97.9 ?F (36.6 ?C) (Oral)   Resp 16   Ht 5\' 3"  (1.6 m)   Wt 83.9 kg   LMP 12/01/2021   SpO2 100%   BMI 32.77 kg/m?  ?Physical Exam ?Vitals and nursing note reviewed.  ?31 year old female, resting comfortably and in no acute distress. Vital signs are normal. Oxygen saturation is 100%, which is normal. ?Head is normocephalic and atraumatic. PERRLA, EOMI. Oropharynx is clear. ?Neck is nontender and supple without adenopathy. ?Lungs are clear without rales, wheezes, or rhonchi. ?Chest is  nontender. ?Heart has regular rate and rhythm without murmur. ?Abdomen is soft, flat, nontender. ?Extremities have no cyanosis or edema, full range of motion is present. ?Skin: Abscess is present in the posterior aspect of the right inguinal fold extending into the distal gluteal.  No vulvar or perianal abscess seen. ?Neurologic: Mental status is normal, cranial nerves are intact, moves all extremities equally. ? ?ED Results / Procedures / Treatments   ? ?Procedures ?26.Incision and Drainage ? ?Date/Time: 12/02/2021 4:31 AM ?Performed by: 12/04/2021, MD ?Authorized by: Dione Booze, MD  ? ?Consent:  ?  Consent obtained:  Verbal ?  Consent given by:  Patient ?  Risks, benefits, and alternatives were discussed: yes   ?  Risks discussed:  Bleeding, infection, incomplete drainage and pain ?  Alternatives discussed:  No treatment ?Universal protocol:  ?  Procedure explained and questions answered to patient or proxy's satisfaction: yes   ?  Relevant documents present and verified: yes   ?  Required blood products, implants, devices, and special equipment available: yes   ?  Site/side marked: yes   ?  Immediately prior to procedure, a time out was called: yes   ?  Patient identity confirmed:  Verbally with patient and arm band ?Location:  ?  Type:  Abscess ?  Size:  3 cm ?  Location:  Anogenital ?  Anogenital location: right groin. ?  Pre-procedure details:  ?  Skin preparation:  Antiseptic wash ?Sedation:  ?  Sedation type:  None ?Anesthesia:  ?  Anesthesia method:  Local infiltration ?  Local anesthetic:  Lidocaine 1% WITH epi ?Procedure type:  ?  Complexity:  Complex ?Procedure details:  ?  Ultrasound guidance: no   ?  Needle aspiration: no   ?  Incision types:  Cruciate ?  Incision depth:  Subcutaneous ?  Wound management:  Probed and deloculated ?  Drainage:  Purulent ?  Drainage amount:  Moderate ?  Wound treatment:  Wound left open ?  Packing materials:  None ?Post-procedure details:  ?  Procedure completion:   Tolerated well, no immediate complications  ? ? ?Medications Ordered in ED ?Medications  ?lidocaine-EPINEPHrine (PF) (XYLOCAINE-EPINEPHrine) 1 %-1:200000 (PF) injection 20 mL (has no administration in time range)  ? ? ?ED Course/ Medical Decision Making/ A&P ?  ?                        ?Medical Decision Making ? ?Inguinal abscess treated with incision and drainage. ? ?Final Clinical Impression(s) / ED Diagnoses ?Final diagnoses:  ?Abscess of right groin  ? ? ?Rx / DC Orders ?ED Discharge Orders   ? ? None  ? ?  ? ? ?  ?Dione Booze, MD ?12/02/21 [phone] ? ?

## 2021-12-02 NOTE — ED Triage Notes (Signed)
Pt states she has HS  Pt states she has an abscess in her perineal area  Pt states she noticed pain and pressure 3 days ago    ?

## 2021-12-15 ENCOUNTER — Emergency Department (HOSPITAL_BASED_OUTPATIENT_CLINIC_OR_DEPARTMENT_OTHER)
Admission: EM | Admit: 2021-12-15 | Discharge: 2021-12-15 | Disposition: A | Discharge status: Home / Self Care | Payer: Medicaid Other | Attending: Emergency Medicine | Admitting: Emergency Medicine

## 2021-12-15 ENCOUNTER — Encounter (HOSPITAL_BASED_OUTPATIENT_CLINIC_OR_DEPARTMENT_OTHER): Payer: Self-pay | Admitting: Emergency Medicine

## 2021-12-15 ENCOUNTER — Other Ambulatory Visit: Payer: Self-pay

## 2021-12-15 DIAGNOSIS — H9202 Otalgia, left ear: Secondary | ICD-10-CM | POA: Diagnosis not present

## 2021-12-15 DIAGNOSIS — F840 Autistic disorder: Secondary | ICD-10-CM | POA: Insufficient documentation

## 2021-12-15 DIAGNOSIS — J45909 Unspecified asthma, uncomplicated: Secondary | ICD-10-CM | POA: Insufficient documentation

## 2021-12-15 DIAGNOSIS — H65192 Other acute nonsuppurative otitis media, left ear: Secondary | ICD-10-CM | POA: Diagnosis not present

## 2021-12-15 DIAGNOSIS — H5712 Ocular pain, left eye: Secondary | ICD-10-CM

## 2021-12-15 MED ORDER — ERYTHROMYCIN 5 MG/GM OP OINT: TOPICAL_OINTMENT | OPHTHALMIC | 0 refills | Status: AC

## 2021-12-15 MED ORDER — TETRACAINE HCL 0.5 % OP SOLN
2.0000 [drp] | Freq: Once | OPHTHALMIC | Status: AC
  Administered 2021-12-15: 2 [drp] via OPHTHALMIC
  Filled 2021-12-15: qty 4

## 2021-12-15 MED ORDER — AMOXICILLIN-POT CLAVULANATE 875-125 MG PO TABS: 1.0000 | ORAL_TABLET | Freq: Two times a day (BID) | ORAL | 0 refills | Status: DC

## 2021-12-15 MED ORDER — FLUORESCEIN SODIUM 1 MG OP STRP
1.0000 | ORAL_STRIP | Freq: Once | OPHTHALMIC | Status: AC
  Administered 2021-12-15: 1 via OPHTHALMIC
  Filled 2021-12-15: qty 1

## 2021-12-15 NOTE — ED Triage Notes (Signed)
Pt arrives pov, to triage with standby assist, c/o left eye pain with photo sensitivity. Also c/o left ear pain. Denies injury. ?

## 2021-12-15 NOTE — ED Provider Notes (Addendum)
?[facility] EMERGENCY DEPARTMENT ?Provider Note ? ? ?CSN: [id number] ?Arrival date & time: 12/15/21  1204 ? ?  ? ?History ? ?Chief Complaint  ?Patient presents with  ? Eye Problem  ? ? ?Brittany Maldonado is a 31 y.o. female with past medical history of asthma, autism, depression, bipolar disorder.  Presents emergency department with a chief complaint of nasal congestion, left ear pain, and left eye pain.  Patient reports that she has been dealing with nasal congestion and left ear pain intermittently over the last week and a half.  Patient states that she feels like she has fluid stuck in her left ear.  Patient also reports that she has been dealing with nasal congestion intermittently over the last week and a half as well ? ?Patient states that she began having redness to her left eye yesterday evening.  Patient had pain begin to left eye as well.  Patient states that pain was originally tolerable has gotten progressively worse over time.  Patient endorses is photophobia.  Patient is unsure if she has had any vision loss that she is unable to keep her eye open.  Patient reports that she has had some watery discharge coming from her eye.  No crusting upon waking throughout the night or this morning.  Patient states that she does wear daily contacts however has not worn them since yesterday evening.  Patient states that yesterday she did put OPTI-FREE solution in her affected eye due to the irritation.  Patient denies any recent injuries or known foreign bodies within her eye. ? ?Patient denies any fever, chills, numbness, weakness, neck pain, neck stiffness. ? ? ?Eye Problem ?Associated symptoms: discharge, photophobia and redness   ?Associated symptoms: no itching, no numbness and no weakness   ? ?  ? ?Home Medications ?Prior to Admission medications   ?Medication Sig Start Date End Date Taking? Authorizing Provider  ?Doxylamine-Pyridoxine (DICLEGIS) 10-10 MG TBEC Take 2 tabs at bedtime. If needed, add  another tab in the morning. If needed, add another tab in the afternoon, up to 4 tabs/day. 05/14/19   Leftwich-Kirby, Wilmer FloorLisa A, CNM  ?ondansetron (ZOFRAN ODT) 4 MG disintegrating tablet Take 1 tablet (4 mg total) by mouth every 8 (eight) hours as needed for nausea or vomiting. 07/26/18   Cristina GongHammond, Elizabeth W, PA-C  ?Prenatal Vit-Fe Fumarate-FA (PRENATAL MULTIVITAMIN) TABS tablet Take 1 tablet by mouth daily at 12 noon.    [provider]  ?   ? ?Allergies    ?Patient has no known allergies.   ? ?Review of Systems   ?Review of Systems  ?Constitutional:  Negative for chills and fever.  ?HENT:  Positive for congestion and ear pain. Negative for ear discharge and sore throat.   ?Eyes:  Positive for photophobia, pain, discharge and redness. Negative for itching.  ?Musculoskeletal:  Negative for neck pain and neck stiffness.  ?Neurological:  Negative for weakness and numbness.  ? ?Physical Exam ?Updated Vital Signs ?BP 108/68   Pulse 91   Temp 98.4 ?F (36.9 ?C) (Oral)   Resp 18   Ht 5\' 3"  (1.6 m)   Wt 88.5 kg   LMP 12/01/2021   SpO2 96%   BMI 34.54 kg/m?  ?Physical Exam ?Vitals and nursing note reviewed.  ?Constitutional:   ?   General: She is not in acute distress. ?   Appearance: She is not ill-appearing, toxic-appearing or diaphoretic.  ?HENT:  ?   Head: Normocephalic. No right periorbital erythema or left periorbital erythema.  ?  Right Ear: Ear canal and external ear normal. No laceration, drainage, swelling or tenderness. There is no impacted cerumen. No foreign body. No mastoid tenderness. Tympanic membrane is scarred. Tympanic membrane is not injected, perforated, erythematous, retracted or bulging.  ?   Left Ear: No laceration, drainage, swelling or tenderness. There is no impacted cerumen. No foreign body. No mastoid tenderness. Tympanic membrane is erythematous and bulging. Tympanic membrane is not injected, scarred, perforated or retracted.  ?   Nose:  ?   Right Sinus: Maxillary sinus tenderness  present. No frontal sinus tenderness.  ?   Left Sinus: Maxillary sinus tenderness present. No frontal sinus tenderness.  ?Eyes:  ?   General: Lids are everted, no foreign bodies appreciated. No scleral icterus.    ?   Right eye: No foreign body, discharge or hordeolum.     ?   Left eye: No foreign body, discharge or hordeolum.  ?   Extraocular Movements: Extraocular movements intact.  ?   Conjunctiva/sclera:  ?   Right eye: Right conjunctiva is not injected. No chemosis, exudate or hemorrhage. ?   Left eye: Left conjunctiva is injected. No chemosis, exudate or hemorrhage. ?   Pupils: Pupils are equal, round, and reactive to light.  ?   Right eye: No corneal abrasion or fluorescein uptake. Seidel exam negative.  ?   Left eye: No corneal abrasion or fluorescein uptake. Seidel exam negative. ?   Comments: Erythema noted to left sclera.  No purulent discharge noted to bilateral eyes.  Patient has difficulty keeping left eye open.  Patient experiencing photophobia.    ?Cardiovascular:  ?   Rate and Rhythm: Normal rate.  ?Pulmonary:  ?   Effort: Pulmonary effort is normal.  ?Skin: ?   General: Skin is warm and dry.  ?Neurological:  ?   General: No focal deficit present.  ?   Mental Status: She is alert.  ?Psychiatric:     ?   Behavior: Behavior is cooperative.  ? ? ?ED Results / Procedures / Treatments   ?Labs ?(all labs ordered are listed, but only abnormal results are displayed) ?Labs Reviewed - No data to display ? ?EKG ?None ? ?Radiology ?No results found. ? ?Procedures ?Procedures  ? ? ?Medications Ordered in ED ?Medications  ?fluorescein ophthalmic strip 1 strip (1 strip Both Eyes Given by Other 12/15/21 1227)  ?tetracaine (PONTOCAINE) 0.5 % ophthalmic solution 2 drop (2 drops Both Eyes Given by Other 12/15/21 1227)  ? ? ?ED Course/ Medical Decision Making/ A&P ?  ?                        ?Medical Decision Making ?Risk ?Prescription drug management. ? ? ?Alert 31 year old female in no acute distress,  nontoxic-appearing.  Patient does appear uncomfortable due to complaints of left eye pain. ? ?Information is obtained from patient and patient's husband at bedside.  Past medical records reviewed including previous provider notes and labs.  ? ?Physical exam patient did have signs consistent with acute otitis media to left ear.  We will start patient on course of Augmentin. ? ?Patient has no periorbital swelling or erythema.  EOM's intact.  Low suspicion for septal or preseptal cellulitis at this time.  No fluorescein uptake noted; low suspicion for HSV or corneal abrasion at this time.   ? ?Patient reports improvement in pain after tetracaine eyedrops were applied. ? ?Patient is refusing visual acuity as well as assessment of intraocular pressures.  I explained  to the patient that without obtaining intraocular pressures we cannot rule out more serious pathology including but not limited to acute closed angle glaucoma at this time patient patient understands and continues to refuse exam at this time.  Patient continues to refuse this exam.  Refuses to let provider even attempt to perform exam.  Patient is refusing this exam AGAINST MEDICAL ADVICE.  We will give patient information to follow-up with ophthalmologist in outpatient setting. ? ?Patient care was discussed with attending physician Dr. Adela Lank. ? ? ? ? ? ? ? ?Final Clinical Impression(s) / ED Diagnoses ?Final diagnoses:  ?Left eye pain  ?Other non-recurrent acute nonsuppurative otitis media of left ear  ? ? ?Rx / DC Orders ?ED Discharge Orders   ? ?      Ordered  ?  amoxicillin-clavulanate (AUGMENTIN) 875-125 MG tablet  Every 12 hours       ? 12/15/21 1254  ?  erythromycin ophthalmic ointment       ? 12/15/21 1254  ? ?  ?  ? ?  ? ? ?  ?Haskel Schroeder, PA-C ?12/15/21 1558 ? ?  ?Haskel Schroeder, PA-C ?12/15/21 2152 ? ?  ?Melene Plan, DO ?12/16/21 8250 ? ?

## 2021-12-15 NOTE — ED Notes (Signed)
Pt declined to sign out AMA, states was provided discharge instructions and prescriptions by provider,  was not able to tolerate eye exam due to extreme photosensitivity, did not refuse treatment. ?

## 2021-12-15 NOTE — ED Notes (Signed)
Pt refused visual acuity in triage ?

## 2021-12-15 NOTE — Discharge Instructions (Signed)
You came to the emergency department today to be evaluated for your left eyebrow and left ear pain.  Your left ear shows signs of ear infection, due to this she was started on the antibiotic Augmentin.  Please take this medication as prescribed.  Please follow-up with your primary care provider for repeat evaluation. ? ?Your left eye exam could not be completed today as you refused to have your pressure checked.  Refusing this exam was AGAINST MEDICAL ADVICE.  Due to this the exact nature of your eye pain cannot be determined.  I have given you information to follow-up with an ophthalmologist.  Please call their office today to schedule an appointment as soon as possible.  I have given you prescription for eye ointment to treat for any possible bacterial eye infection. ? ?Get help right away if: ?You have severe eye pain even when you are not exposed to bright lights. ?You have severe neck pain or stiffness. ?You have severe pain that is not controlled with medicine. ?You have swelling, redness, or pain around your ear. ?You have stiffness in your neck. ?A part of your face is not moving (paralyzed). ?The bone behind your ear (mastoid bone) is tender when you touch it. ?You develop a severe headache ?

## 2022-01-03 ENCOUNTER — Other Ambulatory Visit: Payer: Self-pay

## 2022-01-03 ENCOUNTER — Encounter (HOSPITAL_BASED_OUTPATIENT_CLINIC_OR_DEPARTMENT_OTHER): Payer: Self-pay | Admitting: Emergency Medicine

## 2022-01-03 ENCOUNTER — Emergency Department (HOSPITAL_BASED_OUTPATIENT_CLINIC_OR_DEPARTMENT_OTHER)
Admission: EM | Admit: 2022-01-03 | Discharge: 2022-01-04 | Disposition: A | Discharge status: Home / Self Care | Payer: Medicaid Other | Attending: Emergency Medicine | Admitting: Emergency Medicine

## 2022-01-03 ENCOUNTER — Emergency Department (HOSPITAL_BASED_OUTPATIENT_CLINIC_OR_DEPARTMENT_OTHER): Payer: Medicaid Other

## 2022-01-03 DIAGNOSIS — R4182 Altered mental status, unspecified: Secondary | ICD-10-CM | POA: Diagnosis present

## 2022-01-03 DIAGNOSIS — F17210 Nicotine dependence, cigarettes, uncomplicated: Secondary | ICD-10-CM | POA: Insufficient documentation

## 2022-01-03 DIAGNOSIS — F1410 Cocaine abuse, uncomplicated: Secondary | ICD-10-CM | POA: Diagnosis not present

## 2022-01-03 DIAGNOSIS — R531 Weakness: Secondary | ICD-10-CM | POA: Diagnosis not present

## 2022-01-03 DIAGNOSIS — R200 Anesthesia of skin: Secondary | ICD-10-CM | POA: Insufficient documentation

## 2022-01-03 DIAGNOSIS — R519 Headache, unspecified: Secondary | ICD-10-CM | POA: Diagnosis not present

## 2022-01-03 LAB — RAPID URINE DRUG SCREEN, HOSP PERFORMED
Amphetamines: NOT DETECTED
Barbiturates: NOT DETECTED
Benzodiazepines: NOT DETECTED
Cocaine: POSITIVE — AB
Opiates: NOT DETECTED
Tetrahydrocannabinol: NOT DETECTED

## 2022-01-03 LAB — COMPREHENSIVE METABOLIC PANEL
ALT: 41 U/L (ref 0–44)
AST: 27 U/L (ref 15–41)
Albumin: 3.9 g/dL (ref 3.5–5.0)
Alkaline Phosphatase: 56 U/L (ref 38–126)
Anion gap: 10 (ref 5–15)
BUN: 10 mg/dL (ref 6–20)
CO2: 25 mmol/L (ref 22–32)
Calcium: 9.3 mg/dL (ref 8.9–10.3)
Chloride: 105 mmol/L (ref 98–111)
Creatinine, Ser: 0.63 mg/dL (ref 0.44–1.00)
GFR, Estimated: >60 mL/min (ref >60)
Glucose, Bld: 96 mg/dL (ref 70–99)
Potassium: 3.4 mmol/L — ABNORMAL LOW (ref 3.5–5.1)
Sodium: 140 mmol/L (ref 135–145)
Total Bilirubin: 0.2 mg/dL — ABNORMAL LOW (ref 0.3–1.2)
Total Protein: 7.2 g/dL (ref 6.5–8.1)

## 2022-01-03 LAB — CBC
HCT: 47.7 % — ABNORMAL HIGH (ref 36.0–46.0)
Hemoglobin: 16.1 g/dL — ABNORMAL HIGH (ref 12.0–15.0)
MCH: 29.9 pg (ref 26.0–34.0)
MCHC: 33.8 g/dL (ref 30.0–36.0)
MCV: 88.7 fL (ref 80.0–100.0)
Platelets: 303 10*3/uL (ref 150–400)
RBC: 5.38 MIL/uL — ABNORMAL HIGH (ref 3.87–5.11)
RDW: 12.5 % (ref 11.5–15.5)
WBC: 7.5 10*3/uL (ref 4.0–10.5)
nRBC: 0 % (ref 0.0–0.2)

## 2022-01-03 LAB — URINALYSIS, ROUTINE W REFLEX MICROSCOPIC
Bilirubin Urine: NEGATIVE
Glucose, UA: NEGATIVE mg/dL
Ketones, ur: NEGATIVE mg/dL
Leukocytes,Ua: NEGATIVE
Nitrite: NEGATIVE
Protein, ur: NEGATIVE mg/dL
Specific Gravity, Urine: 1.025 (ref 1.005–1.030)
pH: 6 (ref 5.0–8.0)

## 2022-01-03 LAB — URINALYSIS, MICROSCOPIC (REFLEX)

## 2022-01-03 LAB — PREGNANCY, URINE: Preg Test, Ur: NEGATIVE

## 2022-01-03 MED ORDER — DIPHENHYDRAMINE HCL 50 MG/ML IJ SOLN: 25.0000 mg | Freq: Once | INTRAMUSCULAR | Status: DC

## 2022-01-03 MED ORDER — METOCLOPRAMIDE HCL 5 MG/ML IJ SOLN: 10.0000 mg | Freq: Once | INTRAMUSCULAR | Status: DC

## 2022-01-03 MED ORDER — LORAZEPAM 2 MG/ML IJ SOLN
1.0000 mg | Freq: Once | INTRAMUSCULAR | Status: AC | PRN
  Administered 2022-01-04: 1 mg via INTRAVENOUS
  Filled 2022-01-03: qty 1

## 2022-01-03 MED ORDER — SODIUM CHLORIDE 0.9 % IV BOLUS
1000.0000 mL | Freq: Once | INTRAVENOUS | Status: AC
  Administered 2022-01-04: 1000 mL via INTRAVENOUS

## 2022-01-03 NOTE — ED Notes (Signed)
Patient transported to CT 

## 2022-01-03 NOTE — ED Triage Notes (Signed)
Pt states she went out on Friday.  She had two drinks and went home.  She relates at this point she started  having some memory loss, altered speech, sleeping and insomnia, blurry vision, difficulty speaking.    Symptoms have been ongoing.  No new medications.  No ETOH since Friday.  Pt has taken advil, vitamin b12. No street drugs.   ?

## 2022-01-03 NOTE — ED Notes (Signed)
Patients child asleep on bed with patient  ?

## 2022-01-03 NOTE — ED Provider Notes (Signed)
?[facility] EMERGENCY DEPARTMENT ?Provider Note ? ? ?CSN: [id number] ?Arrival date & time: 01/03/22  1731 ? ?  ? ?History ? ?Chief Complaint  ?Patient presents with  ? Altered Mental Status  ? ? ?Brittany Maldonado is a 31 y.o. female. ? ?Patient with no pertinent past medical history presents today with complaints of altered mental status. She states that on Thursday evening she went to a bar and had 2 beers and went home. She states that she did not feel intoxicated at that time, however she doesn't remember events that occurred after arriving home. She states that she remembers waking up the next morning and taking her child to preschool, however states that soon after that she again lost time. She states that she was told that she called her husband and was speaking inaudibly and misidentifying objects. She states that soon after that she developed slurred speech and a headache that is in the posterior region of her head and different from headaches she has had in the past. She also states that she has noticed some mild right sided weakness and numbness since Thursday as well. She also states that she has been very fatigued since Thursday. Patient adamantly denies any substance abuse, states that she rarely drinks alcohol and has not had anything to drink since Thursday night.  She does smoke 1 pack of cigarettes daily. Denies any history of events similar to this in the past. Does endorse that she was on antibiotics for an ear infection but states that was several weeks ago. ? ?The history is provided by the patient. No language interpreter was used.  ?Altered Mental Status ?Associated symptoms: headaches   ?Associated symptoms: no abdominal pain, no fever, no nausea, no rash and no vomiting   ? ?  ? ?Home Medications ?Prior to Admission medications   ?Medication Sig Start Date End Date Taking? Authorizing Provider  ?amoxicillin-clavulanate (AUGMENTIN) 875-125 MG tablet Take 1 tablet by mouth every 12  (twelve) hours. 12/15/21   Haskel Schroeder, PA-C  ?Doxylamine-Pyridoxine (DICLEGIS) 10-10 MG TBEC Take 2 tabs at bedtime. If needed, add another tab in the morning. If needed, add another tab in the afternoon, up to 4 tabs/day. 05/14/19   Leftwich-Kirby, Wilmer Floor, CNM  ?ondansetron (ZOFRAN ODT) 4 MG disintegrating tablet Take 1 tablet (4 mg total) by mouth every 8 (eight) hours as needed for nausea or vomiting. 07/26/18   Cristina Gong, PA-C  ?Prenatal Vit-Fe Fumarate-FA (PRENATAL MULTIVITAMIN) TABS tablet Take 1 tablet by mouth daily at 12 noon.    [provider]  ?   ? ?Allergies    ?Patient has no known allergies.   ? ?Review of Systems   ?Review of Systems  ?Constitutional:  Negative for chills and fever.  ?Respiratory:  Negative for shortness of breath.   ?Cardiovascular:  Negative for chest pain.  ?Gastrointestinal:  Negative for abdominal pain, diarrhea, nausea and vomiting.  ?Genitourinary:  Negative for dysuria.  ?Musculoskeletal:  Negative for back pain, neck pain and neck stiffness.  ?Skin:  Negative for rash and wound.  ?Neurological:  Positive for headaches.  ?All other systems reviewed and are negative. ? ?Physical Exam ?Updated Vital Signs ?BP 108/79   Pulse 74   Temp 98.1 ?F (36.7 ?C) (Oral)   Resp (!) 25   Ht 5\' 3"  (1.6 m)   Wt 88.5 kg   LMP 12/22/2021   SpO2 100%   BMI 34.54 kg/m?  ?Physical Exam ?Vitals and nursing note reviewed.  ?  Constitutional:   ?   General: She is not in acute distress. ?   Appearance: Normal appearance. She is normal weight. She is not ill-appearing, toxic-appearing or diaphoretic.  ?HENT:  ?   Head: Normocephalic and atraumatic.  ?   Mouth/Throat:  ?   Mouth: Mucous membranes are moist.  ?Eyes:  ?   Extraocular Movements: Extraocular movements intact.  ?   Pupils: Pupils are equal, round, and reactive to light.  ?Neck:  ?   Comments: No neck pain or meningismus ?Cardiovascular:  ?   Rate and Rhythm: Normal rate and regular rhythm.  ?   Heart sounds:  Normal heart sounds.  ?Pulmonary:  ?   Effort: Pulmonary effort is normal. No respiratory distress.  ?   Breath sounds: Normal breath sounds.  ?Abdominal:  ?   General: Abdomen is flat.  ?   Palpations: Abdomen is soft.  ?   Tenderness: There is no abdominal tenderness.  ?Musculoskeletal:     ?   General: Normal range of motion.  ?   Cervical back: Normal range of motion and neck supple. No tenderness.  ?Skin: ?   General: Skin is warm and dry.  ?Neurological:  ?   General: No focal deficit present.  ?   Mental Status: She is alert and oriented to person, place, and time.  ?   GCS: GCS eye subscore is 4. GCS verbal subscore is 5. GCS motor subscore is 6.  ?   Sensory: Sensation is intact.  ?   Motor: Motor function is intact.  ?   Coordination: Coordination is intact. Finger-Nose-Finger Test and Heel to AritonShin Test normal.  ?   Gait: Gait is intact.  ?   Comments: Alert and oriented to self, place, time and event.  ?  ?Speech is slow with some difficulty with word finding but overall intact without obvious slurring ?  ?Strength 5/5 in upper/lower extremities   ?Patient endorses some decrease in sensation on the right face, arm, and leg  ?  ?Grossly intact visual fields bilaterally. Did not visualize posterior eye.  ?PERRLA and EOMs intact bilaterally  ?Facial movements symmetric  ?No uvula deviation, symmetric rise of soft palate  ?Midline tongue protrusion, symmetric L/R movements   ?Psychiatric:     ?   Mood and Affect: Mood normal.     ?   Behavior: Behavior normal.  ? ? ?ED Results / Procedures / Treatments   ?Labs ?(all labs ordered are listed, but only abnormal results are displayed) ?Labs Reviewed  ?COMPREHENSIVE METABOLIC PANEL - Abnormal; Notable for the following components:  ?    Result Value  ? Potassium 3.4 (*)   ? Total Bilirubin 0.2 (*)   ? All other components within normal limits  ?CBC - Abnormal; Notable for the following components:  ? RBC 5.38 (*)   ? Hemoglobin 16.1 (*)   ? HCT 47.7 (*)   ? All  other components within normal limits  ?URINALYSIS, ROUTINE W REFLEX MICROSCOPIC - Abnormal; Notable for the following components:  ? Hgb urine dipstick TRACE (*)   ? All other components within normal limits  ?RAPID URINE DRUG SCREEN, HOSP PERFORMED - Abnormal; Notable for the following components:  ? Cocaine POSITIVE (*)   ? All other components within normal limits  ?URINALYSIS, MICROSCOPIC (REFLEX) - Abnormal; Notable for the following components:  ? Bacteria, UA MANY (*)   ? All other components within normal limits  ?PREGNANCY, URINE  ? ? ?EKG ?  None ? ?Radiology ?CT Head Wo Contrast ? ?Result Date: 01/03/2022 ?CLINICAL DATA:  Each difficulty and altered mental status, initial encounter EXAM: CT HEAD WITHOUT CONTRAST TECHNIQUE: Contiguous axial images were obtained from the base of the skull through the vertex without intravenous contrast. RADIATION DOSE REDUCTION: This exam was performed according to the departmental dose-optimization program which includes automated exposure control, adjustment of the mA and/or kV according to patient size and/or use of iterative reconstruction technique. COMPARISON:  None. FINDINGS: Brain: No evidence of acute infarction, hemorrhage, hydrocephalus, extra-axial collection or mass lesion/mass effect. Vascular: No hyperdense vessel or unexpected calcification. Skull: Normal. Negative for fracture or focal lesion. Sinuses/Orbits: No acute finding. Other: None. IMPRESSION: No acute intracranial abnormality noted. Electronically Signed   By: Alcide Clever M.D.   On: 01/03/2022 21:50   ? ?Procedures ?Procedures  ? ? ?Medications Ordered in ED ?Medications  ?sodium chloride 0.9 % bolus 1,000 mL (has no administration in time range)  ?metoCLOPramide (REGLAN) injection 10 mg (has no administration in time range)  ?diphenhydrAMINE (BENADRYL) injection 25 mg (has no administration in time range)  ? ? ?ED Course/ Medical Decision Making/ A&P ?  ?                        ?Medical Decision  Making ?Amount and/or Complexity of Data Reviewed ?Labs: ordered. ?Radiology: ordered. ? ?Risk ?Prescription drug management. ? ? ?This patient presents to the ED for concern of AMS, this involves an extensive numb

## 2022-01-03 NOTE — ED Notes (Signed)
Patient leaving [facility] now, going to [facility]. ?

## 2022-01-03 NOTE — ED Notes (Signed)
ED tech Mikayla sitting with patient's 29 year old child in pt's room while patient was transported to CT. ?

## 2022-01-03 NOTE — ED Notes (Signed)
This RN spoke to [employer], Agricultural consultant at [facility] regarding ED to ED transfer of this patient for an MRI. Pt will be transported in [place] with husband after dropping off 31 year old at home with grandmother. Pt will be transported with no IV access (charge RN aware); medication orders and MRI order placed by Dr. Darl Householder. ?

## 2022-01-03 NOTE — ED Notes (Signed)
Patient returned to room from CT. 

## 2022-01-04 ENCOUNTER — Emergency Department (HOSPITAL_COMMUNITY): Payer: Medicaid Other

## 2022-01-04 MED ORDER — GADOBUTROL 1 MMOL/ML IV SOLN
9.0000 mL | Freq: Once | INTRAVENOUS | Status: AC | PRN
  Administered 2022-01-04: 9 mL via INTRAVENOUS

## 2022-01-04 NOTE — ED Notes (Signed)
E-signature pad unavailable at time of pt discharge. This RN discussed discharge materials with pt and answered all pt questions. Pt stated understanding of discharge material. ? ?

## 2022-01-04 NOTE — ED Provider Notes (Signed)
NCAT ?PERRL ?RRR ?CTAB ?NABS ? ?Results for orders placed or performed during the hospital encounter of 01/03/22  ?Comprehensive metabolic panel  ?Result Value Ref Range  ? Sodium 140 135 - 145 mmol/L  ? Potassium 3.4 (L) 3.5 - 5.1 mmol/L  ? Chloride 105 98 - 111 mmol/L  ? CO2 25 22 - 32 mmol/L  ? Glucose, Bld 96 70 - 99 mg/dL  ? BUN 10 6 - 20 mg/dL  ? Creatinine, Ser 0.63 0.44 - 1.00 mg/dL  ? Calcium 9.3 8.9 - 10.3 mg/dL  ? Total Protein 7.2 6.5 - 8.1 g/dL  ? Albumin 3.9 3.5 - 5.0 g/dL  ? AST 27 15 - 41 U/L  ? ALT 41 0 - 44 U/L  ? Alkaline Phosphatase 56 38 - 126 U/L  ? Total Bilirubin 0.2 (L) 0.3 - 1.2 mg/dL  ? GFR, Estimated >60 >60 mL/min  ? Anion gap 10 5 - 15  ?CBC  ?Result Value Ref Range  ? WBC 7.5 4.0 - 10.5 K/uL  ? RBC 5.38 (H) 3.87 - 5.11 MIL/uL  ? Hemoglobin 16.1 (H) 12.0 - 15.0 g/dL  ? HCT 47.7 (H) 36.0 - 46.0 %  ? MCV 88.7 80.0 - 100.0 fL  ? MCH 29.9 26.0 - 34.0 pg  ? MCHC 33.8 30.0 - 36.0 g/dL  ? RDW 12.5 11.5 - 15.5 %  ? Platelets 303 150 - 400 K/uL  ? nRBC 0.0 0.0 - 0.2 %  ?Pregnancy, urine  ?Result Value Ref Range  ? Preg Test, Ur NEGATIVE NEGATIVE  ?Urinalysis, Routine w reflex microscopic Urine, Clean Catch  ?Result Value Ref Range  ? Color, Urine YELLOW YELLOW  ? APPearance CLEAR CLEAR  ? Specific Gravity, Urine 1.025 1.005 - 1.030  ? pH 6.0 5.0 - 8.0  ? Glucose, UA NEGATIVE NEGATIVE mg/dL  ? Hgb urine dipstick TRACE (A) NEGATIVE  ? Bilirubin Urine NEGATIVE NEGATIVE  ? Ketones, ur NEGATIVE NEGATIVE mg/dL  ? Protein, ur NEGATIVE NEGATIVE mg/dL  ? Nitrite NEGATIVE NEGATIVE  ? Leukocytes,Ua NEGATIVE NEGATIVE  ?Rapid urine drug screen (hospital performed)  ?Result Value Ref Range  ? Opiates NONE DETECTED NONE DETECTED  ? Cocaine POSITIVE (A) NONE DETECTED  ? Benzodiazepines NONE DETECTED NONE DETECTED  ? Amphetamines NONE DETECTED NONE DETECTED  ? Tetrahydrocannabinol NONE DETECTED NONE DETECTED  ? Barbiturates NONE DETECTED NONE DETECTED  ?Urinalysis, Microscopic (reflex)  ?Result Value Ref Range   ? RBC / HPF 0-5 0 - 5 RBC/hpf  ? WBC, UA 0-5 0 - 5 WBC/hpf  ? Bacteria, UA MANY (A) NONE SEEN  ? Squamous Epithelial / LPF 0-5 0 - 5  ? Mucus PRESENT   ? ?CT Head Wo Contrast ? ?Result Date: 01/03/2022 ?CLINICAL DATA:  Each difficulty and altered mental status, initial encounter EXAM: CT HEAD WITHOUT CONTRAST TECHNIQUE: Contiguous axial images were obtained from the base of the skull through the vertex without intravenous contrast. RADIATION DOSE REDUCTION: This exam was performed according to the departmental dose-optimization program which includes automated exposure control, adjustment of the mA and/or kV according to patient size and/or use of iterative reconstruction technique. COMPARISON:  None. FINDINGS: Brain: No evidence of acute infarction, hemorrhage, hydrocephalus, extra-axial collection or mass lesion/mass effect. Vascular: No hyperdense vessel or unexpected calcification. Skull: Normal. Negative for fracture or focal lesion. Sinuses/Orbits: No acute finding. Other: None. IMPRESSION: No acute intracranial abnormality noted. Electronically Signed   By: Alcide Clever M.D.   On: 01/03/2022 21:50  ? ?MR Brain W  and Wo Contrast ? ?Result Date: 01/04/2022 ?CLINICAL DATA:  Memory loss, altered speech EXAM: MRI HEAD WITHOUT AND WITH CONTRAST TECHNIQUE: Multiplanar, multiecho pulse sequences of the brain and surrounding structures were obtained without and with intravenous contrast. CONTRAST:  46mL GADAVIST GADOBUTROL 1 MMOL/ML IV SOLN COMPARISON:  No prior MRI, correlation made with CT head 01/03/2022 FINDINGS: Brain: No restricted diffusion to suggest acute or subacute infarct. No acute hemorrhage, mass, mass effect, or midline shift. No hydrocephalus or extra-axial collection. No abnormal parenchymal or meningeal enhancement. No abnormal foci of T2 hyperintense signal in the periventricular or juxtacortical white matter. Normal pituitary. Vascular: Normal flow voids. Patent venous sinuses on postcontrast  imaging. Skull and upper cervical spine: Normal marrow signal. Sinuses/Orbits: No acute finding. Other: The mastoids are well aerated. IMPRESSION: No acute intracranial process. No etiology is seen for the patient's memory loss or altered speech. Electronically Signed   By: Wiliam Ke M.D.   On: 01/04/2022 03:39   ? ? ?  Cy Blamer, MD ?01/04/22 3267 ? ?

## 2022-01-04 NOTE — ED Notes (Signed)
Patient transported to MRI 

## 2023-05-28 ENCOUNTER — Other Ambulatory Visit: Payer: Self-pay

## 2023-05-28 ENCOUNTER — Emergency Department (HOSPITAL_BASED_OUTPATIENT_CLINIC_OR_DEPARTMENT_OTHER)
Admission: EM | Admit: 2023-05-28 | Discharge: 2023-05-28 | Disposition: A | Discharge status: Home / Self Care | Payer: MEDICAID | Attending: Emergency Medicine | Admitting: Emergency Medicine

## 2023-05-28 ENCOUNTER — Emergency Department (HOSPITAL_BASED_OUTPATIENT_CLINIC_OR_DEPARTMENT_OTHER): Payer: MEDICAID

## 2023-05-28 DIAGNOSIS — M79672 Pain in left foot: Secondary | ICD-10-CM | POA: Insufficient documentation

## 2023-05-28 MED ORDER — CELECOXIB 200 MG PO CAPS: 200.0000 mg | ORAL_CAPSULE | Freq: Two times a day (BID) | ORAL | 0 refills | Status: DC

## 2023-05-28 NOTE — ED Triage Notes (Signed)
Patient presents to ED via POV from home. Reports 3 weeks ago her bare foot went through her wooden deck. Here with left foot pain.

## 2023-05-28 NOTE — ED Notes (Signed)
Pt advised her heel struck a board when it fell through a deck. Direct trauma to the heel of her L foot. No puncture, hemorrhage, or deformity noted, but now feels "like a nail is in her heel" in sensation. Radiating pain with pressure upon ambulation. Can only wear super cushioned foam sandals for comfort. Chronic daily pain, with spikes with ambulation.

## 2023-05-28 NOTE — ED Notes (Signed)

## 2023-05-28 NOTE — ED Provider Notes (Signed)
[place] EMERGENCY DEPARTMENT AT [facility] Provider Note   CSN: [id number] Arrival date & time: 05/28/23  0830     History  Chief Complaint  Patient presents with   Foot Pain    Brittany Maldonado is a 32 y.o. female who presents emergency department chief complaint of left foot pain.  Patient states that she purchased a new house.  She did not know that there was a rotten board on the deck.  She fell through the deck and her foot landed on a board about a foot down underneath the back.  Since that time she has had pain in the bottom of her foot and posterior left heel.  She states that it is progressively worsening.  She states that she feels like she is being stabbed in the heel.  It is worse with ambulation and better with rest.  She complains that the pain radiates up the back of her calf.  She has tried ice, Tylenol, Motrin without significant relief.  She states that the pain is also worse when she has been resting and first bears weight.  It does ease up a bit but is still painful and does not seem to be getting any better.   Foot Pain       Home Medications Prior to Admission medications   Medication Sig Start Date End Date Taking? Authorizing Provider  amoxicillin-clavulanate (AUGMENTIN) 875-125 MG tablet Take 1 tablet by mouth every 12 (twelve) hours. Patient not taking: Reported on 01/04/2022 12/15/21   Haskel Schroeder, PA-C  Doxylamine-Pyridoxine (DICLEGIS) 10-10 MG TBEC Take 2 tabs at bedtime. If needed, add another tab in the morning. If needed, add another tab in the afternoon, up to 4 tabs/day. Patient not taking: Reported on 01/04/2022 05/14/19   Sharen Counter A, CNM  ibuprofen (ADVIL) 200 MG tablet Take 400 mg by mouth every 6 (six) hours as needed for headache or moderate pain.    [provider]  moxifloxacin (VIGAMOX) 0.5 % ophthalmic solution Place 1 drop into the left eye See admin instructions. Qid x 2 weeks Patient not taking:  Reported on 01/04/2022 12/15/21   [provider]  ondansetron (ZOFRAN ODT) 4 MG disintegrating tablet Take 1 tablet (4 mg total) by mouth every 8 (eight) hours as needed for nausea or vomiting. Patient not taking: Reported on 01/04/2022 07/26/18   Cristina Gong, PA-C      Allergies    Patient has no known allergies.    Review of Systems   Review of Systems  Physical Exam Updated Vital Signs BP 116/69   Pulse 94   Temp 97.9 F (36.6 C) (Oral)   Resp 18   Ht 5\' 3"  (1.6 m)   Wt 83.9 kg   SpO2 100%   BMI 32.77 kg/m  Physical Exam Vitals and nursing note reviewed.  Constitutional:      General: She is not in acute distress.    Appearance: She is well-developed. She is not diaphoretic.  HENT:     Head: Normocephalic and atraumatic.     Right Ear: External ear normal.     Left Ear: External ear normal.     Nose: Nose normal.     Mouth/Throat:     Mouth: Mucous membranes are moist.  Eyes:     General: No scleral icterus.    Conjunctiva/sclera: Conjunctivae normal.  Cardiovascular:     Rate and Rhythm: Normal rate and regular rhythm.     Heart sounds: Normal  heart sounds. No murmur heard.    No friction rub. No gallop.  Pulmonary:     Effort: Pulmonary effort is normal. No respiratory distress.     Breath sounds: Normal breath sounds.  Abdominal:     General: Bowel sounds are normal. There is no distension.     Palpations: Abdomen is soft. There is no mass.     Tenderness: There is no abdominal tenderness. There is no guarding.  Musculoskeletal:     Cervical back: Normal range of motion.     Comments: Tenderness to palpation of the left Achilles insertion, point tenderness at the midpoint of the left heel, full range of motion without significant pain, normal strength, DP PT pulse 2+ with normal sensation  Skin:    General: Skin is warm and dry.  Neurological:     Mental Status: She is alert and oriented to person, place, and time.  Psychiatric:         Behavior: Behavior normal.     ED Results / Procedures / Treatments   Labs (all labs ordered are listed, but only abnormal results are displayed) Labs Reviewed - No data to display  EKG None  Radiology No results found.  Procedures Procedures    Medications Ordered in ED Medications - No data to display  ED Course/ Medical Decision Making/ A&P Clinical Course as of 05/28/23 1053  Fri May 28, 2023  1047 DG Foot Complete Left [AH]    Clinical Course User Index [AH] Arthor Captain, PA-C                                 Medical Decision Making Patient here with heel pain.  Differential diagnosis includes plantar fasciitis, Achilles tendinitis, fracture, talus contusion, infection  Amount and/or Complexity of Data Reviewed Radiology: ordered.  Patient here with left foot pain in the heel.  I suspect after evaluation that the patient has combination of both plantar fasciitis and Achilles tendinitis.  She has no evidence of fracture and I have low suspicion for occult fracture given no swelling bruising or other findings of acute traumatic injury on examination.  Patient will be given anti-inflammatory medications, RICE protocol and referral to specialist.  Discussed outpatient follow-up and return precautions.  Appropriate for discharge at this time        Final Clinical Impression(s) / ED Diagnoses Final diagnoses:  Pain of left heel    Rx / DC Orders ED Discharge Orders     None         Arthor Captain, PA-C 05/28/23 1251    Loetta Rough, MD 05/28/23 [phone]

## 2023-05-28 NOTE — Discharge Instructions (Addendum)
Contact a health care provider if you have: Symptoms that do not go away with home treatment. Pain that gets worse. Pain that affects your ability to move or do daily activities.

## 2023-06-15 ENCOUNTER — Ambulatory Visit (INDEPENDENT_AMBULATORY_CARE_PROVIDER_SITE_OTHER): Payer: MEDICAID | Admitting: Podiatry

## 2023-06-15 DIAGNOSIS — Z91199 Patient's noncompliance with other medical treatment and regimen due to unspecified reason: Secondary | ICD-10-CM

## 2023-06-15 NOTE — Progress Notes (Signed)
No show

## 2023-07-16 ENCOUNTER — Emergency Department (HOSPITAL_BASED_OUTPATIENT_CLINIC_OR_DEPARTMENT_OTHER): Payer: MEDICAID

## 2023-07-16 ENCOUNTER — Encounter (HOSPITAL_BASED_OUTPATIENT_CLINIC_OR_DEPARTMENT_OTHER): Payer: Self-pay

## 2023-07-16 ENCOUNTER — Emergency Department (HOSPITAL_BASED_OUTPATIENT_CLINIC_OR_DEPARTMENT_OTHER)
Admission: EM | Admit: 2023-07-16 | Discharge: 2023-07-16 | Disposition: A | Discharge status: Home / Self Care | Payer: MEDICAID | Attending: Emergency Medicine | Admitting: Emergency Medicine

## 2023-07-16 DIAGNOSIS — M25471 Effusion, right ankle: Secondary | ICD-10-CM | POA: Diagnosis not present

## 2023-07-16 DIAGNOSIS — M25571 Pain in right ankle and joints of right foot: Secondary | ICD-10-CM | POA: Diagnosis present

## 2023-07-16 MED ORDER — MELOXICAM 7.5 MG PO TABS: 7.5000 mg | ORAL_TABLET | Freq: Every day | ORAL | 0 refills | Status: AC

## 2023-07-16 NOTE — Discharge Instructions (Signed)
Please read and follow all provided instructions.  Your diagnoses today include:  1. Acute right ankle pain     Tests performed today include: An x-ray of the affected area - results returned prior to discharge, does NOT show any broken bones or other problems Vital signs. See below for your results today.   Medications prescribed:  Meloxicam - anti-inflammatory pain medication  You have been prescribed an anti-inflammatory medication or NSAID. Take with food. Do not take aspirin, ibuprofen, or naproxen if taking this medication. Take smallest effective dose for the shortest duration needed for your pain. Stop taking if you experience stomach pain or vomiting.   Take any prescribed medications only as directed.  Home care instructions:  Follow any educational materials contained in this packet Follow R.I.C.E. Protocol: R - rest your injury  I  - use ice on injury without applying directly to skin C - compress injury with bandage or splint E - elevate the injury as much as possible  Follow-up instructions: Please follow-up with your primary care provider or the provided orthopedic physician (bone specialist) if you continue to have significant pain in 1 week. In this case you may have a more severe injury that requires further care.   Return instructions:  Please return if your toes or feet are numb or tingling, appear gray or blue, or you have severe pain (also elevate the leg and loosen splint or wrap if you were given one) Please return to the Emergency Department if you experience worsening symptoms.  Please return if you have any other emergent concerns.  Additional Information:  Your vital signs today were: BP 112/63 (BP Location: Right Arm)   Pulse 88   Temp 97.9 F (36.6 C) (Oral)   Resp 18   Ht 5\' 3"  (1.6 m)   Wt 88.5 kg   LMP 06/27/2023 (Approximate)   SpO2 100%   BMI 34.54 kg/m  If your blood pressure (BP) was elevated above 135/85 this visit, please have this  repeated by your doctor within one month.

## 2023-07-16 NOTE — ED Provider Notes (Signed)
[facility] Provider Note   CSN: [id number] Arrival date & time: 07/16/23  0850     History  Chief Complaint  Patient presents with   Ankle Pain    Brittany Maldonado is a 32 y.o. female.  Patient presents to the emergency department today for evaluation of lateral right sided ankle pain.  Patient reports several days of pain without acute injury at onset.  She does walk on her feet but has been trying to stay off of it recently due to pain.  Pain is worse with bearing weight and ambulation.  No knee or hip pain.  No fevers.  She states that at the end of the day the area is more swollen and warm.  She has tried over-the-counter anti-inflammatories that she had from a previous visit with her doctor without improvement thus far.       Home Medications Prior to Admission medications   Medication Sig Start Date End Date Taking? Authorizing Provider  celecoxib (CELEBREX) 200 MG capsule Take 1 capsule (200 mg total) by mouth 2 (two) times daily. 05/28/23  Yes Harris, Abigail, PA-C  ibuprofen (ADVIL) 200 MG tablet Take 400 mg by mouth every 6 (six) hours as needed for headache or moderate pain.   Yes [provider]  amoxicillin-clavulanate (AUGMENTIN) 875-125 MG tablet Take 1 tablet by mouth every 12 (twelve) hours. Patient not taking: Reported on 01/04/2022 12/15/21   Haskel Schroeder, PA-C  Doxylamine-Pyridoxine (DICLEGIS) 10-10 MG TBEC Take 2 tabs at bedtime. If needed, add another tab in the morning. If needed, add another tab in the afternoon, up to 4 tabs/day. Patient not taking: Reported on 01/04/2022 05/14/19   Hurshel Party, CNM  moxifloxacin (VIGAMOX) 0.5 % ophthalmic solution Place 1 drop into the left eye See admin instructions. Qid x 2 weeks Patient not taking: Reported on 01/04/2022 12/15/21   [provider]  ondansetron (ZOFRAN ODT) 4 MG disintegrating tablet Take 1 tablet (4 mg total) by mouth every 8  (eight) hours as needed for nausea or vomiting. Patient not taking: Reported on 01/04/2022 07/26/18   Cristina Gong, PA-C      Allergies    Patient has no known allergies.    Review of Systems   Review of Systems  Physical Exam Updated Vital Signs BP 112/63 (BP Location: Right Arm)   Pulse 88   Temp 97.9 F (36.6 C) (Oral)   Resp 18   Ht 5\' 3"  (1.6 m)   Wt 88.5 kg   LMP 06/27/2023 (Approximate)   SpO2 100%   BMI 34.54 kg/m  Physical Exam Vitals and nursing note reviewed.  Constitutional:      Appearance: She is well-developed.  HENT:     Head: Normocephalic and atraumatic.  Eyes:     Conjunctiva/sclera: Conjunctivae normal.  Cardiovascular:     Pulses:          Dorsalis pedis pulses are 2+ on the right side and 2+ on the left side.       Posterior tibial pulses are 2+ on the right side and 2+ on the left side.  Musculoskeletal:        General: Tenderness present.     Cervical back: Normal range of motion and neck supple.     Right ankle: No swelling or deformity. Tenderness present. Normal range of motion.     Comments: Tenderness to palpation over the inferior margin of the distal fibula on the lateral right  ankle.  No overlying erythema or swelling.  Skin:    General: Skin is warm and dry.  Neurological:     Mental Status: She is alert.     Comments: Distal motor, sensation, and vascular intact.      ED Results / Procedures / Treatments   Labs (all labs ordered are listed, but only abnormal results are displayed) Labs Reviewed - No data to display  EKG None  Radiology DG Ankle Complete Right  Result Date: 07/16/2023 CLINICAL DATA:  Ankle pain EXAM: RIGHT ANKLE - COMPLETE 3 VIEW COMPARISON:  None Available. FINDINGS: There is no evidence of fracture, dislocation, or joint effusion. There is no evidence of arthropathy or other focal bone abnormality. Soft tissues are unremarkable. Small well corticated plantar calcaneal spur. IMPRESSION: No acute  osseous abnormality Electronically Signed   By: Karen Kays M.D.   On: 07/16/2023 11:10    Procedures Procedures    Medications Ordered in ED Medications - No data to display  ED Course/ Medical Decision Making/ A&P    Patient seen and examined. History obtained directly from patient.   Labs/EKG: None ordered  Imaging: Ordered x-ray of the ankle.  Medications/Fluids: None ordered  Most recent vital signs reviewed and are as follows: BP 112/63 (BP Location: Right Arm)   Pulse 88   Temp 97.9 F (36.6 C) (Oral)   Resp 18   Ht 5\' 3"  (1.6 m)   Wt 88.5 kg   LMP 06/27/2023 (Approximate)   SpO2 100%   BMI 34.54 kg/m   Initial impression: Ankle pain  11:23 AM Reassessment performed. Patient appears stable.  Imaging personally visualized and interpreted including: X-ray of the ankle, agree negative.  Reviewed pertinent lab work and imaging with patient at bedside. Questions answered.   Most current vital signs reviewed and are as follows: BP 112/63 (BP Location: Right Arm)   Pulse 88   Temp 97.9 F (36.6 C) (Oral)   Resp 18   Ht 5\' 3"  (1.6 m)   Wt 88.5 kg   LMP 06/27/2023 (Approximate)   SpO2 100%   BMI 34.54 kg/m   Plan: Discharge to home.  Will provide with ASO for the ankle.  Prescriptions written for: Meloxicam  Other home care instructions discussed: RICE protocol  ED return instructions discussed: New or worsening symptoms  Follow-up instructions discussed: Patient encouraged to follow-up with their PCP or ortho referral in 7 days.                                    Medical Decision Making Amount and/or Complexity of Data Reviewed Radiology: ordered.  Risk Prescription drug management.   Patient with right ankle pain, no erythema, mild swelling.  Low concern for joint infection.  X-ray negative for fracture.  Foot is neurovascularly intact.  Currently routine care indicated with outpatient follow-up as needed.  The patient's vital signs,  pertinent lab work and imaging were reviewed and interpreted as discussed in the ED course. Hospitalization was considered for further testing, treatments, or serial exams/observation. However as patient is well-appearing, has a stable exam, and reassuring studies today, I do not feel that they warrant admission at this time. This plan was discussed with the patient who verbalizes agreement and comfort with this plan and seems reliable and able to return to the Emergency Department with worsening or changing symptoms.          Final  Clinical Impression(s) / ED Diagnoses Final diagnoses:  Acute right ankle pain    Rx / DC Orders ED Discharge Orders          Ordered    meloxicam (MOBIC) 7.5 MG tablet  Daily        07/16/23 1122              Renne Crigler, PA-C 07/16/23 1124    Lonell Grandchild, MD 07/16/23 1420

## 2023-07-16 NOTE — ED Triage Notes (Signed)
Pt reports that she is having right ankle pain that started 2 days ago. Denies falling. States that she also has some swelling and that it is hard to walk.

## 2023-07-28 ENCOUNTER — Ambulatory Visit: Payer: MEDICAID | Admitting: Podiatry

## 2023-08-10 ENCOUNTER — Ambulatory Visit (INDEPENDENT_AMBULATORY_CARE_PROVIDER_SITE_OTHER): Payer: MEDICAID

## 2023-08-10 ENCOUNTER — Ambulatory Visit (INDEPENDENT_AMBULATORY_CARE_PROVIDER_SITE_OTHER): Payer: MEDICAID | Admitting: Podiatry

## 2023-08-10 ENCOUNTER — Encounter: Payer: Self-pay | Admitting: Podiatry

## 2023-08-10 DIAGNOSIS — M722 Plantar fascial fibromatosis: Secondary | ICD-10-CM | POA: Diagnosis not present

## 2023-08-10 DIAGNOSIS — M79671 Pain in right foot: Secondary | ICD-10-CM

## 2023-08-10 DIAGNOSIS — M79672 Pain in left foot: Secondary | ICD-10-CM | POA: Diagnosis not present

## 2023-08-10 MED ORDER — MELOXICAM 15 MG PO TABS: 15.0000 mg | ORAL_TABLET | Freq: Every day | ORAL | 0 refills | Status: AC

## 2023-08-10 MED ORDER — TRIAMCINOLONE ACETONIDE 10 MG/ML IJ SUSP: 2.5000 mg | Freq: Once | INTRAMUSCULAR | Status: AC

## 2023-08-10 MED ORDER — DEXAMETHASONE SODIUM PHOSPHATE 120 MG/30ML IJ SOLN: 4.0000 mg | Freq: Once | INTRAMUSCULAR | Status: AC

## 2023-08-10 NOTE — Patient Instructions (Signed)

## 2023-08-10 NOTE — Progress Notes (Signed)
  Subjective:  Patient ID: Brittany Maldonado, female    DOB: Jun 29, 1991,   MRN: [id number]  No chief complaint on file.   32 y.o. female presents for concern of bilateral heel pain more so the left that has been ongoing for a couple months. Relates an injury to the left foot when this started by falling through a deck. RELates heel pain and pain all the time even when sitting. Hurts mostly when on feet after the end of the day. Has tried stretching, supports, night splints and meloxicam. They have helped but temporarily  . Denies any other pedal complaints. Denies n/v/f/c.   Past Medical History:  Diagnosis Date   Anxiety    Asthma    exercise induced   Depression    good  right now   Hidradenitis suppurativa     Objective:  Physical Exam: Vascular: DP/PT pulses 2/4 bilateral. CFT <3 seconds. Normal hair growth on digits. No edema.  Skin. No lacerations or abrasions bilateral feet.  Musculoskeletal: MMT 5/5 bilateral lower extremities in DF, PF, Inversion and Eversion. Deceased ROM in DF of ankle joint. Tender to the medial calcaneal tubercle left and right less so . No pain with achilles, PT or arch. No pain with calcaneal squeeze. Neurological: Sensation intact to light touch.   Assessment:   1. Plantar fasciitis, left   2. Plantar fasciitis, right      Plan:  Patient was evaluated and treated and all questions answered. Discussed plantar fasciitis with patient.  X-rays reviewed and discussed with patient. No acute fractures or dislocations noted. Mild spurring noted at inferior calcaneus.  Discussed treatment options including, ice, NSAIDS, supportive shoes, bracing, and stretching. Stretching exercises provided to be done on a daily basis.   Prescription for meloxicam provided and sent to pharmacy. Patient requesting injection today. Procedure note below.  PF brace dispensed bilateral.   Follow-up 6 weeks or sooner if any problems arise. In the meantime, encouraged to call  the office with any questions, concerns, change in symptoms.   Procedure:  Discussed etiology, pathology, conservative vs. surgical therapies. At this time a plantar fascial injection was recommended.  The patient agreed and a sterile skin prep was applied.  An injection consisting of  1cc dexamethasone 0.5 cc kenalog and 1cc marcaine mixture was infiltrated at the point of maximal tenderness on the left Heel.  Bandaid applied. The patient tolerated this well and was given instructions for aftercare.     Louann Sjogren, DPM

## 2023-09-21 ENCOUNTER — Encounter: Payer: Self-pay | Admitting: Podiatry

## 2023-09-21 ENCOUNTER — Ambulatory Visit (INDEPENDENT_AMBULATORY_CARE_PROVIDER_SITE_OTHER): Payer: MEDICAID | Admitting: Podiatry

## 2023-09-21 DIAGNOSIS — M722 Plantar fascial fibromatosis: Secondary | ICD-10-CM | POA: Diagnosis not present

## 2023-09-21 NOTE — Progress Notes (Signed)
  Subjective:  Patient ID: Brittany Maldonado, female    DOB: 25-Apr-1991,   MRN: [id number]  No chief complaint on file.   32 y.o. female presents for follow-up of bilateral plantar fasciitis but mostly the left. Relates the right side is 100% better and the left is about 80% better. Relates it was doing really well and starting to get some more pain. Has been stretching and wearing the brace which has helped.   . Denies any other pedal complaints. Denies n/v/f/c.   Past Medical History:  Diagnosis Date   Anxiety    Asthma    exercise induced   Depression    good  right now   Hidradenitis suppurativa     Objective:  Physical Exam: Vascular: DP/PT pulses 2/4 bilateral. CFT <3 seconds. Normal hair growth on digits. No edema.  Skin. No lacerations or abrasions bilateral feet.  Musculoskeletal: MMT 5/5 bilateral lower extremities in DF, PF, Inversion and Eversion. Deceased ROM in DF of ankle joint. Minimally tender to the medial calcaneal tubercle left and right no pain . No pain with achilles, PT or arch. No pain with calcaneal squeeze. Neurological: Sensation intact to light touch.   Assessment:   1. Plantar fasciitis, left   2. Plantar fasciitis, right       Plan:  Patient was evaluated and treated and all questions answered. Discussed plantar fasciitis with patient.  X-rays reviewed and discussed with patient. No acute fractures or dislocations noted. Mild spurring noted at inferior calcaneus.  Discussed treatment options including, ice, NSAIDS, supportive shoes, bracing, and stretching.  Continue brace and stretching.  Discussed CMO and patient will look into and consider.   Follow-up as needed    Asberry Failing, DPM

## 2024-05-15 ENCOUNTER — Ambulatory Visit (INDEPENDENT_AMBULATORY_CARE_PROVIDER_SITE_OTHER): Payer: MEDICAID | Admitting: Podiatry

## 2024-05-15 DIAGNOSIS — Z91199 Patient's noncompliance with other medical treatment and regimen due to unspecified reason: Secondary | ICD-10-CM

## 2024-05-15 NOTE — Progress Notes (Signed)
 No show

## 2024-06-23 ENCOUNTER — Emergency Department (HOSPITAL_BASED_OUTPATIENT_CLINIC_OR_DEPARTMENT_OTHER): Payer: MEDICAID

## 2024-06-23 ENCOUNTER — Emergency Department (HOSPITAL_BASED_OUTPATIENT_CLINIC_OR_DEPARTMENT_OTHER)
Admission: EM | Admit: 2024-06-23 | Discharge: 2024-06-23 | Disposition: A | Discharge status: Home / Self Care | Payer: MEDICAID | Attending: Emergency Medicine | Admitting: Emergency Medicine

## 2024-06-23 ENCOUNTER — Other Ambulatory Visit: Payer: Self-pay

## 2024-06-23 ENCOUNTER — Encounter (HOSPITAL_BASED_OUTPATIENT_CLINIC_OR_DEPARTMENT_OTHER): Payer: Self-pay

## 2024-06-23 DIAGNOSIS — R059 Cough, unspecified: Secondary | ICD-10-CM | POA: Diagnosis not present

## 2024-06-23 DIAGNOSIS — J45909 Unspecified asthma, uncomplicated: Secondary | ICD-10-CM | POA: Insufficient documentation

## 2024-06-23 DIAGNOSIS — Y92410 Unspecified street and highway as the place of occurrence of the external cause: Secondary | ICD-10-CM | POA: Diagnosis not present

## 2024-06-23 DIAGNOSIS — R519 Headache, unspecified: Secondary | ICD-10-CM | POA: Insufficient documentation

## 2024-06-23 DIAGNOSIS — M25512 Pain in left shoulder: Secondary | ICD-10-CM | POA: Diagnosis not present

## 2024-06-23 DIAGNOSIS — S0992XA Unspecified injury of nose, initial encounter: Secondary | ICD-10-CM | POA: Diagnosis present

## 2024-06-23 DIAGNOSIS — S022XXA Fracture of nasal bones, initial encounter for closed fracture: Secondary | ICD-10-CM | POA: Diagnosis not present

## 2024-06-23 LAB — PREGNANCY, URINE: Preg Test, Ur: NEGATIVE

## 2024-06-23 MED ORDER — ACETAMINOPHEN 500 MG PO TABS
1000.0000 mg | ORAL_TABLET | Freq: Once | ORAL | Status: AC
  Administered 2024-06-23: 1000 mg via ORAL
  Filled 2024-06-23: qty 2

## 2024-06-23 MED ORDER — LIDOCAINE 5 % EX PTCH
1.0000 | MEDICATED_PATCH | CUTANEOUS | Status: DC
  Administered 2024-06-23: 1 via TRANSDERMAL
  Filled 2024-06-23: qty 1

## 2024-06-23 MED ORDER — DIPHENHYDRAMINE HCL 50 MG/ML IJ SOLN
25.0000 mg | Freq: Once | INTRAMUSCULAR | Status: AC
  Administered 2024-06-23: 25 mg via INTRAVENOUS
  Filled 2024-06-23: qty 1

## 2024-06-23 MED ORDER — ALBUTEROL SULFATE HFA 108 (90 BASE) MCG/ACT IN AERS: 1.0000 | INHALATION_SPRAY | Freq: Once | RESPIRATORY_TRACT | Status: DC

## 2024-06-23 MED ORDER — METOCLOPRAMIDE HCL 5 MG/ML IJ SOLN
10.0000 mg | Freq: Once | INTRAMUSCULAR | Status: AC
  Administered 2024-06-23: 10 mg via INTRAVENOUS
  Filled 2024-06-23: qty 2

## 2024-06-23 NOTE — ED Notes (Signed)
 Pt going to rad dept.
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
## 2024-06-23 NOTE — Discharge Instructions (Addendum)
 As discussed, you will need to follow up with ENT. Please do not blow your nose until ENT clears you. Please also follow up with primary care. Seek emergency care if experiencing any new or worsening symptoms.   Alternating between 650 mg Tylenol  and 400 mg Advil : The best way to alternate taking Acetaminophen  (example Tylenol ) and Ibuprofen  (example Advil /Motrin ) is to take them 3 hours apart. For example, if you take ibuprofen  at 6 am you can then take Tylenol  at 9 am. You can continue this regimen throughout the day, making sure you do not exceed the recommended maximum dose for each drug.
